# Patient Record
Sex: Female | Born: 1985
Health system: Southern US, Community
[De-identification: ages and names within clinical notes are randomized; demographics above are authoritative.]

## PROBLEM LIST (undated history)

## (undated) DIAGNOSIS — Z789 Other specified health status: Secondary | ICD-10-CM

## (undated) DIAGNOSIS — R519 Headache, unspecified: Secondary | ICD-10-CM

## (undated) HISTORY — PX: NO PAST SURGERIES: SHX2092

## (undated) HISTORY — DX: Headache, unspecified: R51.9

---

## 2012-09-23 ENCOUNTER — Encounter (HOSPITAL_COMMUNITY): Payer: Self-pay

## 2012-09-23 ENCOUNTER — Emergency Department (HOSPITAL_COMMUNITY)
Admission: EM | Admit: 2012-09-23 | Discharge: 2012-09-23 | Disposition: A | Discharge status: Home / Self Care | Payer: No Typology Code available for payment source | Source: Home / Self Care

## 2012-09-23 DIAGNOSIS — L605 Yellow nail syndrome: Secondary | ICD-10-CM

## 2012-09-23 DIAGNOSIS — B354 Tinea corporis: Secondary | ICD-10-CM

## 2012-09-23 DIAGNOSIS — J329 Chronic sinusitis, unspecified: Secondary | ICD-10-CM

## 2012-09-23 DIAGNOSIS — L608 Other nail disorders: Secondary | ICD-10-CM

## 2012-09-23 DIAGNOSIS — R51 Headache: Secondary | ICD-10-CM

## 2012-09-23 LAB — IRON AND TIBC: TIBC: 379 ug/dL (ref 250–470)

## 2012-09-23 LAB — CBC
HCT: 44.4 % (ref 36.0–46.0)
Hemoglobin: 14.8 g/dL (ref 12.0–15.0)
MCH: 31.8 pg (ref 26.0–34.0)
MCHC: 33.3 g/dL (ref 30.0–36.0)
MCV: 95.3 fL (ref 78.0–100.0)
Platelets: 324 10*3/uL (ref 150–400)
RBC: 4.66 MIL/uL (ref 3.87–5.11)
RDW: 11.8 % (ref 11.5–15.5)
WBC: 9 10*3/uL (ref 4.0–10.5)

## 2012-09-23 LAB — COMPREHENSIVE METABOLIC PANEL
ALT: 22 U/L (ref 0–35)
AST: 19 U/L (ref 0–37)
Albumin: 4.8 g/dL (ref 3.5–5.2)
Alkaline Phosphatase: 69 U/L (ref 39–117)
BUN: 9 mg/dL (ref 6–23)
CO2: 24 mEq/L (ref 19–32)
Calcium: 9.8 mg/dL (ref 8.4–10.5)
Chloride: 99 mEq/L (ref 96–112)
Creatinine, Ser: 0.5 mg/dL (ref 0.50–1.10)
GFR calc Af Amer: >90 mL/min (ref >90)
GFR calc non Af Amer: >90 mL/min (ref >90)
Glucose, Bld: 84 mg/dL (ref 70–99)
Potassium: 3.8 mEq/L (ref 3.5–5.1)
Sodium: 140 mEq/L (ref 135–145)
Total Bilirubin: 0.3 mg/dL (ref 0.3–1.2)
Total Protein: 8.5 g/dL — ABNORMAL HIGH (ref 6.0–8.3)

## 2012-09-23 LAB — FOLATE: Folate: >20 ng/mL

## 2012-09-23 LAB — POCT URINALYSIS DIP (DEVICE)
Bilirubin Urine: NEGATIVE
Nitrite: NEGATIVE
pH: 5 (ref 5.0–8.0)

## 2012-09-23 LAB — VITAMIN B12: Vitamin B-12: >2000 pg/mL — ABNORMAL HIGH (ref 211–911)

## 2012-09-23 MED ORDER — LORATADINE-PSEUDOEPHEDRINE ER 5-120 MG PO TB12: 1.0000 | ORAL_TABLET | Freq: Two times a day (BID) | ORAL | Status: DC

## 2012-09-23 MED ORDER — KETOCONAZOLE 2 % EX CREA: TOPICAL_CREAM | Freq: Every day | CUTANEOUS | Status: DC

## 2012-09-23 MED ORDER — AMOXICILLIN 875 MG PO TABS: 875.0000 mg | ORAL_TABLET | Freq: Two times a day (BID) | ORAL | Status: DC

## 2012-09-23 NOTE — ED Provider Notes (Signed)
History   CSN: [id number]  Arrival date & time 09/23/12  1030  Chief Complaint  Patient presents with  . Nail Problem   The history is provided by the patient. The history is limited by a language barrier. A language interpreter was used.  Pt reports that she is noticing that she is having yellow coloration of her fingernails and a rash on her legs. No recent changes in diet.  Urine has been bright yellow.  She has been noticing no changes to skin tone or to eyes.  No pruritus. Pt denies taking any medications except for some allergy medication (doesn't know name), pt denies any other problems or changes.  She is also having headaches.  She denies nausea and vomiting.  The patient reports no recent use of nail polish is or anything like that.  The patient reports that she's not having any abdominal pain nausea or vomiting.  History reviewed. No pertinent past medical history.  History reviewed. No pertinent past surgical history.  No family history on file.  History  Substance Use Topics  . Smoking status: Not on file  . Smokeless tobacco: Not on file  . Alcohol Use: Not on file    OB History    Grav Para Term Preterm Abortions TAB SAB Ect Mult Living                 Review of Systems  Constitutional: Negative.   HENT: Negative.   Eyes: Negative.   Respiratory: Negative.   Cardiovascular: Negative.   Gastrointestinal: Negative.   Genitourinary: Negative.   Musculoskeletal: Negative.   Skin:       Yellowing of nails   Neurological: Negative.   Hematological: Negative.   Psychiatric/Behavioral: Negative.     Allergies  Review of patient's allergies indicates no known allergies.  Home Medications  No current outpatient prescriptions on file.  BP 103/63  Pulse 60  Temp 98 F (36.7 C) (Oral)  Resp 19  SpO2 100%  Physical Exam  Nursing note and vitals reviewed. Constitutional: She is oriented to person, place, and time. She appears well-developed and  well-nourished. No distress.  HENT:  Head: Normocephalic and atraumatic.  Nose: Mucosal edema, rhinorrhea and sinus tenderness present. Right sinus exhibits frontal sinus tenderness. Left sinus exhibits frontal sinus tenderness.  Eyes: EOM are normal. Pupils are equal, round, and reactive to light.  Neck: Normal range of motion. Neck supple.  Cardiovascular: Normal rate, regular rhythm and normal heart sounds.   Pulmonary/Chest: Effort normal and breath sounds normal.  Abdominal: Soft. Bowel sounds are normal. She exhibits no distension and no mass. There is no tenderness. There is no rebound and no guarding.  Musculoskeletal: Normal range of motion. She exhibits no edema.       Hands: Neurological: She is alert and oriented to person, place, and time.  Skin: Skin is warm and dry. Rash noted.       Hypopigmented rash noted on left leg around calf area appears to be a yeast on the skin and signs of scratching noted  Psychiatric: She has a normal mood and affect. Her behavior is normal. Judgment and thought content normal.   ED Course  Procedures (including critical care time)  Labs Reviewed - No data to display No results found.   No diagnosis found.   MDM  IMPRESSION  Tinea corporis  Yellowing of nails   Headaches  sinusitis  RECOMMENDATIONS / PLAN Check CBC, CMP, urinalysis, liver enzymes, iron level, B12, folate, Vit  D Amoxicillin 875 bid  claritin D Ketoconazole 2% creme to rash on leg With the patient's yellow nails on going to try to rule out an underlying metabolic problem.  I'm going to check metabolic panel and CBC.  Check iron levels and vitamin D.  B12 levels.  Also order urinalysis because she reported that she's had bright yellow urine.  I'm looking for bilirubin.  FOLLOW UP 2 weeks   The patient was given clear instructions to go to ER or return to medical center if symptoms don't improve, worsen or new problems develop.  The patient verbalized  understanding.  The patient was told to call to get lab results if they haven't heard anything in the next week.            Cleora Fleet, MD 09/23/12 1442

## 2012-09-23 NOTE — ED Notes (Signed)
Complains of nail beds are yellow on her hands, stomach is hard and white spots on her left leg for a couple of weeks

## 2012-09-24 LAB — VITAMIN D 25 HYDROXY (VIT D DEFICIENCY, FRACTURES): Vit D, 25-Hydroxy: 34 ng/mL (ref 30–89)

## 2012-09-26 NOTE — Progress Notes (Signed)
Quick Note:  Please notify patient in Spanish that her Vitamin B12 level came back very high. It was over 2000. Please tell her that her other labs came back OK. Please tell patient that she needs to be referred to a hematologist to have further work up for the elevated B12 levels. Please tell patient to use NO NAIL POLISH on her yellow nails. I am not sure if the elevated B12 levels is causing her nails to be yellow. Please refer pt to a hematologist regarding high B12 levels and rule out polycythemia vera.   Rodney Langton, MD, CDE, FAAFP [facility] [place], [place]   ______

## 2012-09-27 ENCOUNTER — Telehealth (HOSPITAL_COMMUNITY): Payer: Self-pay

## 2012-09-27 NOTE — Telephone Encounter (Signed)
Message copied by Lestine Mount on Tue Sep 27, 2012  5:40 PM ------      Message from: Cleora Fleet      Created: Mon Sep 26, 2012  9:52 AM       Please notify patient in Spanish that her Vitamin B12 level came back very high.  It was over 2000.  Please tell her that her other labs came back OK.  Please tell patient that she needs to be referred to a hematologist to have further work up for the elevated B12 levels.  Please tell patient to use NO NAIL POLISH on her yellow nails.  I am not sure if the elevated B12 levels is causing her nails to be yellow.  Please refer pt to a hematologist regarding high B12 levels and rule out polycythemia vera.              Rodney Langton, MD, CDE, FAAFP      [facility]      [facility]      [place], [place]

## 2012-09-28 ENCOUNTER — Telehealth (HOSPITAL_COMMUNITY): Payer: Self-pay

## 2012-09-28 NOTE — Telephone Encounter (Signed)
Message copied by Lestine Mount on Wed Sep 28, 2012 10:16 AM ------      Message from: Cleora Fleet      Created: Mon Sep 26, 2012  9:52 AM       Please notify patient in Spanish that her Vitamin B12 level came back very high.  It was over 2000.  Please tell her that her other labs came back OK.  Please tell patient that she needs to be referred to a hematologist to have further work up for the elevated B12 levels.  Please tell patient to use NO NAIL POLISH on her yellow nails.  I am not sure if the elevated B12 levels is causing her nails to be yellow.  Please refer pt to a hematologist regarding high B12 levels and rule out polycythemia vera.              Rodney Langton, MD, CDE, FAAFP      [facility]      [facility]      [place], [place]

## 2012-09-28 NOTE — Telephone Encounter (Signed)
Message copied by Lestine Mount on Wed Sep 28, 2012 10:48 AM ------      Message from: Cleora Fleet      Created: Mon Sep 26, 2012  9:52 AM       Please notify patient in Spanish that her Vitamin B12 level came back very high.  It was over 2000.  Please tell her that her other labs came back OK.  Please tell patient that she needs to be referred to a hematologist to have further work up for the elevated B12 levels.  Please tell patient to use NO NAIL POLISH on her yellow nails.  I am not sure if the elevated B12 levels is causing her nails to be yellow.  Please refer pt to a hematologist regarding high B12 levels and rule out polycythemia vera.              Rodney Langton, MD, CDE, FAAFP      [facility]      [facility]      [place], [place]

## 2012-10-14 ENCOUNTER — Ambulatory Visit: Payer: No Typology Code available for payment source

## 2012-10-14 ENCOUNTER — Encounter: Payer: Self-pay | Admitting: Oncology

## 2012-10-14 ENCOUNTER — Ambulatory Visit (HOSPITAL_BASED_OUTPATIENT_CLINIC_OR_DEPARTMENT_OTHER): Payer: No Typology Code available for payment source | Admitting: Oncology

## 2012-10-14 ENCOUNTER — Other Ambulatory Visit (HOSPITAL_BASED_OUTPATIENT_CLINIC_OR_DEPARTMENT_OTHER): Payer: No Typology Code available for payment source | Admitting: Lab

## 2012-10-14 VITALS — BP 106/76 | HR 65 | Temp 97.3°F | Resp 18 | Ht 60.0 in | Wt 107.2 lb

## 2012-10-14 DIAGNOSIS — E538 Deficiency of other specified B group vitamins: Secondary | ICD-10-CM

## 2012-10-14 DIAGNOSIS — R21 Rash and other nonspecific skin eruption: Secondary | ICD-10-CM

## 2012-10-14 DIAGNOSIS — M2550 Pain in unspecified joint: Secondary | ICD-10-CM

## 2012-10-14 DIAGNOSIS — L608 Other nail disorders: Secondary | ICD-10-CM

## 2012-10-14 DIAGNOSIS — R7989 Other specified abnormal findings of blood chemistry: Secondary | ICD-10-CM

## 2012-10-14 LAB — CBC WITH DIFFERENTIAL/PLATELET
Basophils Absolute: 0 10*3/uL (ref 0.0–0.1)
Eosinophils Absolute: 0.1 10*3/uL (ref 0.0–0.5)
HGB: 13.3 g/dL (ref 11.6–15.9)
LYMPH%: 33.7 % (ref 14.0–49.7)
MCV: 94.6 fL (ref 79.5–101.0)
MONO%: 7.7 % (ref 0.0–14.0)
NEUT#: 4.7 10*3/uL (ref 1.5–6.5)
Platelets: 275 10*3/uL (ref 145–400)
RBC: 4.21 10*6/uL (ref 3.70–5.45)

## 2012-10-14 LAB — MORPHOLOGY

## 2012-10-14 NOTE — Progress Notes (Signed)
Pt speaks Spanish and little [place].  States no medications at this time and no known allergies.  Assessment deferred to Dr. Gaylyn Rong.

## 2012-10-14 NOTE — Progress Notes (Signed)
Checked in new patient. Spanish but no interprtor. She did well..just a little english. No insurance or medicaid. She does have orange card. I have her application for medicaid and for our assistance. She did say she is [place] citizen. 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 I told her to get our application back to me and gave her directions to DSS for medicaid.

## 2012-10-14 NOTE — Progress Notes (Signed)
[facility]  [phone] [phone]     INITIAL HEMATOLOGY CONSULTATION    Referral MD:  Dr. Cleora Fleet, M.D.   Reason for Referral: elevated Vit B12 level.     HPI: Nichole Hughes is a 27 year-old woman from [place].  He was in [employer].  She noticed diffuse bone pain in the shoulders and weakness. She thought that she had anemia, and started taking VitB12 on her own.  She visit her PCP and VitB12 level was checked which was elevated.  She was thus kindly referred to the [facility].   Nichole Hughes presented to the Cancer Center for the first time today with her sister.  She still has bilateral shoulder pain.  She has some weakness; however, she is working almost full time at [employer].  She has stopped taking Vit B12. She has slightly decrease appetite but no significant weight loss.  She denied visible source of bleeding.  She complained of skin rash on the face and bilateral feet.  They used to be rash rash and now turned to whitish flecks.  She denied fingers or toes swelling or pain. She complained of yellow nails lately.  She denied chest pain, SOB, pleurisy.   Patient denies fever, headache, visual changes, confusion, drenching night sweats, palpable lymph node swelling, mucositis, odynophagia, dysphagia, nausea vomiting, jaundice, gum bleeding, epistaxis, hematemesis, hemoptysis, abdominal pain, abdominal swelling, early satiety, melena, hematochezia, hematuria, skin rash, spontaneous bleeding, heat or cold intolerance, bowel bladder incontinence, back pain, focal motor weakness, paresthesia.    No past medical history on file.:  None.     No past surgical history on file.:  None.    CURRENT MEDS: No current outpatient prescriptions on file.      No Known Allergies:  No family history on file.:  History   Social History  . Marital Status: Single    Spouse Name: N/A    Number of Children: 0  . Years of  Education: N/A   Occupational History  .      [employer]   Social History Main Topics  . Smoking status: Not on file  . Smokeless tobacco: Not on file  . Alcohol Use: Not on file  . Drug Use: Not on file  . Sexually Active: Not on file   Other Topics Concern  . Not on file   Social History Narrative  . No narrative on file  :  REVIEW OF SYSTEM:  The rest of the 14-point review of sytem was negative.   Exam: ECOG 0-1  General:  Thin-appearing woman in no acute distress.  Eyes:  no scleral icterus.  ENT:  There were no oropharyngeal lesions.  Neck was without thyromegaly.  Lymphatics:  Negative cervical, supraclavicular or axillary adenopathy.  Respiratory: lungs were clear bilaterally without wheezing or crackles.  Cardiovascular:  Regular rate and rhythm, S1/S2, without murmur, rub or gallop.  There was no pedal edema.  GI:  abdomen was soft, flat, nontender, nondistended, without organomegaly.  Muscoloskeletal:  no spinal tenderness of palpation of vertebral spine.  She had yellow nails without cyanosis or clubbing.  Skin exam was without echymosis, petichae. I could not appreciate any rash on her face or feet.  Neuro exam was nonfocal.  Patient was able to get on and off exam table without assistance.  Gait was normal.  Patient was alerted and oriented.  Attention was good.   Language was appropriate.  Mood was normal without depression.  Speech was not pressured.  Thought content was not tangential.    LABS:  Lab Results  Component Value Date   WBC 8.3 10/14/2012   HGB 13.3 10/14/2012   HCT 39.8 10/14/2012   PLT 275 10/14/2012   GLUCOSE 84 09/23/2012   ALT 22 09/23/2012   AST 19 09/23/2012   NA 140 09/23/2012   K 3.8 09/23/2012   CL 99 09/23/2012   CREATININE 0.50 09/23/2012   BUN 9 09/23/2012   CO2 24 09/23/2012    Blood smear review:   I personally reviewed the patient's peripheral blood smear today.  There was isocytosis.  There was no peripheral blast.  There was no  schistocytosis, spherocytosis, target cell, rouleaux formation, tear drop cell.  There was no giant platelets or platelet clumps.      ASSESSMENT AND PLAN:   1.  Elevated Vit B12:  There is no evidence of bone marrow problem.  In elderly patients with Vit 12 elevation, MDS is a potential issue.  Nichole Hughes has normal CBC and morphology, and I have no clinical concern for bone marrow failure.  She has since stopped taking Vit B12.  There is no reason to check Vit B12 level again unless she develops anemia in the future.  No further work up is indicated at this time.   2.  Yellow nail:  Unclear etiology.  I referred her back to her PCP to see if further work up is indicated for any type of pulmonary issue that can cause yellow nail syndrome.  But from my history, there was no pulmonary issue to warrant obvious emergent work up.   3.  Skin rash and joint pain:  I referred her back to her PCP to see if autoimmune work up is appropriate.   4.  Dispo:  Discharged from the Cancer Center.  Follow up PRN.    Thank you for this referral.   The length of time of the face-to-face encounter was 30 minutes. More than 50% of time was spent counseling and coordination of care.     Thank you for this referral.

## 2012-11-15 ENCOUNTER — Encounter: Payer: Self-pay | Admitting: Oncology

## 2012-11-15 NOTE — Progress Notes (Signed)
Received the application for assistance back from the patient as well as the app for Medicaid. She was suppose to mail or take back to them. I tried to call to tell her but the line is busy at [phone].

## 2012-11-22 ENCOUNTER — Encounter: Payer: Self-pay | Admitting: Oncology

## 2012-11-22 NOTE — Progress Notes (Signed)
Never received current bank statement from the patient. She also bought back the medicaid application and it was not filled out. This EPP will expire 12/08/12.

## 2012-11-23 ENCOUNTER — Emergency Department (HOSPITAL_COMMUNITY)
Admission: EM | Admit: 2012-11-23 | Discharge: 2012-11-23 | Disposition: A | Discharge status: Home / Self Care | Payer: No Typology Code available for payment source | Source: Home / Self Care

## 2012-11-23 ENCOUNTER — Encounter (HOSPITAL_COMMUNITY): Payer: Self-pay

## 2012-11-23 DIAGNOSIS — K2990 Gastroduodenitis, unspecified, without bleeding: Secondary | ICD-10-CM

## 2012-11-23 DIAGNOSIS — K2970 Gastritis, unspecified, without bleeding: Secondary | ICD-10-CM

## 2012-11-23 MED ORDER — ONDANSETRON 4 MG PO TBDP: 4.0000 mg | ORAL_TABLET | Freq: Four times a day (QID) | ORAL | Status: DC | PRN

## 2012-11-23 NOTE — ED Notes (Signed)
Patient  States started vomiting today with fever

## 2012-11-23 NOTE — ED Provider Notes (Signed)
History     CSN: [id number]  Arrival date & time 11/23/12  1230   Chief Complaint  Patient presents with  . Emesis    (Consider location/radiation/quality/duration/timing/severity/associated sxs/prior treatment) HPI  Patient is a 27 year old female who presents with main concern of 2 day duration of nonbloody vomiting associated with a generalized abdominal discomfort. Patient denies similar episodes in the past, no specific sick contacts or exposures, no recent hospitalizations, no recent antibiotic use. Patient denies chest pain or shortness of breath. Patient describes abdominal pain as intermittent in nature, dull, crampy-like, 3/10 in severity when present, nonradiating, no specific alleviating or aggravating factors. She denies fevers and chills, no diarrhea, no other systemic concerns.  No past medical history  History reviewed. No pertinent past surgical history.  No known family medical history  History  Substance Use Topics  . Smoking status: Not on file  . Smokeless tobacco: Not on file  . Alcohol Use: Not on file    OB History   Grav Para Term Preterm Abortions TAB SAB Ect Mult Living                  Review of Systems Review of Systems  Constitutional: Negative for fever, chills, diaphoresis, activity change, appetite change and fatigue.  HENT: Negative for ear pain, nosebleeds, congestion, facial swelling, rhinorrhea, neck pain, neck stiffness and ear discharge.   Eyes: Negative for pain, discharge, redness, itching and visual disturbance.  Respiratory: Negative for cough, choking, chest tightness, shortness of breath, wheezing and stridor.   Cardiovascular: Negative for chest pain, palpitations and leg swelling.  Gastrointestinal: Negative for abdominal distention.  Genitourinary: Negative for dysuria, urgency, frequency, hematuria, flank pain, decreased urine volume, difficulty urinating and dyspareunia.  Musculoskeletal: Negative for back pain, joint  swelling, arthralgias and gait problem.  Neurological: Negative for dizziness, tremors, seizures, syncope, facial asymmetry, speech difficulty, weakness, light-headedness, numbness and headaches.  Hematological: Negative for adenopathy. Does not bruise/bleed easily.  Psychiatric/Behavioral: Negative for hallucinations, behavioral problems, confusion, dysphoric mood, decreased concentration and agitation.    Allergies  Review of patient's allergies indicates no known allergies.  Home Medications  No current outpatient prescriptions on file.  Temp(Src) 98 F (36.7 C) (Oral)  Physical Exam Physical Exam  Constitutional: Appears well-developed and well-nourished. No distress.  HENT: Normocephalic. External right and left ear normal. Oropharynx is clear and moist.  Eyes: Conjunctivae and EOM are normal. PERRLA, no scleral icterus.  Neck: Normal ROM. Neck supple. No JVD. No tracheal deviation. No thyromegaly.  CVS: RRR, S1/S2 +, no murmurs, no gallops, no carotid bruit.  Pulmonary: Effort and breath sounds normal, no stridor, rhonchi, wheezes, rales.  Abdominal: Soft. BS +,  no distension, tenderness, rebound or guarding.  Musculoskeletal: Normal range of motion. No edema and no tenderness.  Lymphadenopathy: No lymphadenopathy noted, cervical, inguinal. Neuro: Alert. Normal reflexes, muscle tone coordination. No cranial nerve deficit. Skin: Skin is warm and dry. No rash noted. Not diaphoretic. No erythema. No pallor.  Psychiatric: Normal mood and affect. Behavior, judgment, thought content normal.    ED Course  Procedures (including critical care time)  Labs Reviewed - No data to display No results found.  Viral gastroenteritis  - Clinical symptoms consistent with viral gastroenteritis -  Will provide Zofran for symptomatic relief of nausea vomiting - Patient advised to drink fluids hourly - I have also discussed cutting on oral intake down to clear liquids and slowly advancing to  regular diet as patient able to tolerate - Patient and  advised to come back and see [place] if her symptoms do not improve or get worse   MDM  Viral gastroenteritis         Alison Murray, MD 11/23/12 1321

## 2012-11-28 ENCOUNTER — Encounter: Payer: Self-pay | Admitting: Oncology

## 2012-11-28 NOTE — Progress Notes (Signed)
100% ind 11/28/12-05/31/13  I will send the patient letter and card and all documents have been scanned.

## 2013-01-10 NOTE — ED Notes (Signed)
Referral faxed to [facility] 

## 2013-05-02 ENCOUNTER — Ambulatory Visit: Payer: Self-pay

## 2013-06-26 ENCOUNTER — Ambulatory Visit (HOSPITAL_COMMUNITY)
Admission: RE | Admit: 2013-06-26 | Discharge: 2013-06-26 | Disposition: A | Discharge status: Home / Self Care | Payer: No Typology Code available for payment source | Source: Ambulatory Visit | Attending: Internal Medicine | Admitting: Internal Medicine

## 2013-06-26 ENCOUNTER — Encounter: Payer: Self-pay | Admitting: Internal Medicine

## 2013-06-26 ENCOUNTER — Ambulatory Visit: Payer: No Typology Code available for payment source | Attending: Internal Medicine | Admitting: Internal Medicine

## 2013-06-26 VITALS — BP 114/74 | HR 59 | Temp 98.4°F | Resp 16 | Ht 62.0 in | Wt 110.0 lb

## 2013-06-26 DIAGNOSIS — L819 Disorder of pigmentation, unspecified: Secondary | ICD-10-CM

## 2013-06-26 DIAGNOSIS — M79609 Pain in unspecified limb: Secondary | ICD-10-CM

## 2013-06-26 DIAGNOSIS — M79604 Pain in right leg: Secondary | ICD-10-CM

## 2013-06-26 DIAGNOSIS — Z0000 Encounter for general adult medical examination without abnormal findings: Secondary | ICD-10-CM

## 2013-06-26 DIAGNOSIS — Z23 Encounter for immunization: Secondary | ICD-10-CM

## 2013-06-26 DIAGNOSIS — J029 Acute pharyngitis, unspecified: Secondary | ICD-10-CM

## 2013-06-26 DIAGNOSIS — Z130 Encounter for screening for diseases of the blood and blood-forming organs and certain disorders involving the immune mechanism: Secondary | ICD-10-CM | POA: Insufficient documentation

## 2013-06-26 MED ORDER — TRAMADOL HCL 50 MG PO TABS: 50.0000 mg | ORAL_TABLET | Freq: Three times a day (TID) | ORAL | Status: DC | PRN

## 2013-06-26 MED ORDER — GUAIFENESIN ER 600 MG PO TB12: 1200.0000 mg | ORAL_TABLET | Freq: Two times a day (BID) | ORAL | Status: DC | PRN

## 2013-06-26 NOTE — Addendum Note (Signed)
Addended by: Allayne Stack R on: 06/26/2013 11:03 AM   Modules accepted: Orders

## 2013-06-26 NOTE — Progress Notes (Signed)
CC: Leg pain, congestion  HPI: 27 year old female with no significant past medical history who presented to clinic for evaluation of right lower extremity pain for past couple of weeks. Patient reports pain in the calf muscle but is able to ambulate. Patient also reports feeling sore throat and lot of congestion in the upper respiratory airways. She does report cough but nothing comes out. No fevers or chills. No chest pain. No wheezing or stridor. Patient also reports occasional headaches but no blurry vision.  No Known Allergies History reviewed. No pertinent past medical history. Current Outpatient Prescriptions on File Prior to Visit  Medication Sig Dispense Refill  . ondansetron (ZOFRAN ODT) 4 MG disintegrating tablet Take 1 tablet (4 mg total) by mouth every 6 (six) hours as needed for nausea.  45 tablet  1   No current facility-administered medications on file prior to visit.   Heart disease in parents.  History   Social History  . Marital Status: Single    Spouse Name: N/A    Number of Children: 0  . Years of Education: N/A   Occupational History  .      [employer]   Social History Main Topics  . Smoking status: Never Smoker   . Smokeless tobacco: Not on file  . Alcohol Use: No  . Drug Use: No  . Sexual Activity: Not on file   Other Topics Concern  . Not on file   Social History Narrative  . No narrative on file    Review of Systems  Constitutional: Negative for fever, chills, diaphoresis, activity change, appetite change and fatigue.  HENT: Negative for ear pain, nosebleeds, congestion, facial swelling, rhinorrhea, neck pain, neck stiffness and ear discharge.   Eyes: Negative for pain, discharge, redness, itching and visual disturbance.  Respiratory: Negative for cough, choking, chest tightness, shortness of breath, wheezing and stridor.   Cardiovascular: Negative for chest pain, palpitations and leg swelling.  Gastrointestinal: Negative for abdominal  distention.  Genitourinary: Negative for dysuria, urgency, frequency, hematuria, flank pain, decreased urine volume, difficulty urinating and dyspareunia.  Musculoskeletal: Negative for back pain, joint swelling, arthralgias and gait problem.  pain in the right calf and lower extremity Neurological: Negative for dizziness, tremors, seizures, syncope, facial asymmetry, speech difficulty, weakness, light-headedness, numbness and positive for headaches.  Hematological: Negative for adenopathy. Does not bruise/bleed easily.  Psychiatric/Behavioral: Negative for hallucinations, behavioral problems, confusion, dysphoric mood, decreased concentration and agitation.    Objective:   Filed Vitals:   06/26/13 0954  BP: 114/74  Pulse: 59  Temp: 98.4 F (36.9 C)  Resp: 16    Physical Exam  Constitutional: Appears well-developed and well-nourished. No distress.  HENT: Normocephalic. External right and left ear normal. Oropharynx is clear and moist.  Eyes: Conjunctivae and EOM are normal. PERRLA, no scleral icterus.  Neck: Normal ROM. Neck supple. No JVD. No tracheal deviation. No thyromegaly.  CVS: RRR, S1/S2 +, no murmurs, no gallops, no carotid bruit.  Pulmonary: Effort and breath sounds normal, no stridor, rhonchi, wheezes, rales.  Abdominal: Soft. BS +,  no distension, tenderness, rebound or guarding.  Musculoskeletal: Normal range of motion. No edema and no tenderness.  Lymphadenopathy: No lymphadenopathy noted, cervical, inguinal. Neuro: Alert. Normal reflexes, muscle tone coordination. No cranial nerve deficit. Skin: Whitish discoloration of her left lower extremity, random patches of whitish skin.  Psychiatric: Normal mood and affect. Behavior, judgment, thought content normal.   Lab Results  Component Value Date   WBC 8.3 10/14/2012   HGB 13.3  10/14/2012   HCT 39.8 10/14/2012   MCV 94.6 10/14/2012   PLT 275 10/14/2012   Lab Results  Component Value Date   CREATININE 0.50 09/23/2012    BUN 9 09/23/2012   NA 140 09/23/2012   K 3.8 09/23/2012   CL 99 09/23/2012   CO2 24 09/23/2012    No results found for this basename: HGBA1C   Lipid Panel  No results found for this basename: chol, trig, hdl, cholhdl, vldl, ldlcalc       Assessment and plan:   Patient Active Problem List   Diagnosis Date Noted  .  Whitish skin discoloration  - Dermatology referral provided and patient advised not to use cortisone-containing creams - Check TSH and ANA  09/26/2012     Sore throat, congestion  - Start Mucinex twice daily as needed and patient instructed to see [place] in 2 weeks to see if symptoms are improving      Health maintenance - referral to GYN provided for screening PAP - last PAP smear done in 2005, negative - flu shot today     Right lower extremity pain  - Order lower extremity Doppler to rule out DVT and to analyze for possible Baker's cyst

## 2013-06-26 NOTE — Progress Notes (Signed)
Vascular lab called with results Doppler was neg DVT

## 2013-06-26 NOTE — Progress Notes (Signed)
Pt is here today for a F/U visit. For 2 months she has had chest congestion. Coughing up phlegm and runny nose. Pt reports that she is having hair loss for 3 months Pt also has discoloration on her legs. She received a topical cream that didn't help.

## 2013-06-26 NOTE — Progress Notes (Signed)
VASCULAR LAB PRELIMINARY  PRELIMINARY  PRELIMINARY  PRELIMINARY  Right lower extremity venous duplex completed.    Preliminary report:  Right:  No evidence of DVT, superficial thrombosis, or Baker's cyst.  Knowledge Escandon, RVS 06/26/2013, 12:52 PM

## 2013-06-26 NOTE — Patient Instructions (Addendum)
Dolor de garganta   (Sore Throat)   El dolor de garganta es el dolor, ardor, irritación o sensación de picazón en la garganta. Generalmente hay dolor o molestias al tragar o hablar. Un dolor de garganta puede estar acompañado de otros síntomas, como tos, estornudos, fiebre y ganglios hinchados en el cuello. Generalmente es el primer signo de otra enfermedad, como un resfrio, gripe, anginas o mononucleosis (conocida como mono). La mayor parte de los dolores de garganta desaparecen sin tratamiento médico.  CAUSAS   Las causas más comunes de dolor de garganta son:   · Infecciones virales, como un resfrio, gripe o mononucleosis.  · Infección bacteriana, como faringitis estreptocócica, amigdalitis, o tos ferina.  · Alergias estacionales.  · La sequedad en el aire.  · Algunos irritantes, como el humo o la polución.  · Reflujo gastroesofágico.  INSTRUCCIONES PARA EL CUIDADO EN EL HOGAR   · Tome sólo la medicación que le indicó el médico.  · Debe ingerir gran cantidad de líquido para mantener la orina de tono claro o color amarillo pálido.  · Descanse todo lo que sea necesario.  · Trate de usar aerosoles para la garganta, pastillas o chupe caramelos duros para aliviar el dolor (si es mayor de 4 años o según lo que le indiquen).  · Beba líquidos calientes, como caldos, infusiones de hierbas o agua caliente con miel para calmar el dolor momentáneamente. También puede comer o beber líquidos fríos o congelados tales como paletas de hielo congelado.  · Haga gárgaras con agua con sal (mezclar 1 cucharadita de sal en 8 onzas [250 cm3] de agua).  · No fume, y evite el humo de otros fumadores.  · Ponga un humidificador de vapor frío en la habitación por la noche para humedecer el aire. También se puede activar en una ducha de agua caliente y sentarse en el baño con la puerta cerrada durante 5-10 minutos.  SOLICITE ATENCIÓN MÉDICA DE INMEDIATO SI:   · Tiene dificultad para respirar.  · No puede tragar líquidos, alimentos blandos, o  su saliva.  · Usted tiene más inflamación en la garganta.  · El dolor de garganta no mejora en 7 días.  · Tiene náuseas o vómitos.  · Tiene fiebre o síntomas que persisten durante más de 2 o 3 días.  · Tiene fiebre y los síntomas empeoran de manera súbita.  ASEGÚRESE DE QUE:   · Comprende estas instrucciones.  · Controlará su enfermedad.  · Solicitará ayuda de inmediato si no mejora o si empeora.  Document Released: 08/31/2005 Document Revised: 08/17/2012  ExitCare® Patient Information ©2014 ExitCare, LLC.

## 2013-08-07 ENCOUNTER — Encounter: Payer: No Typology Code available for payment source | Admitting: Advanced Practice Midwife

## 2014-04-20 ENCOUNTER — Emergency Department (INDEPENDENT_AMBULATORY_CARE_PROVIDER_SITE_OTHER)
Admission: EM | Admit: 2014-04-20 | Discharge: 2014-04-20 | Disposition: A | Discharge status: Home / Self Care | Payer: Self-pay | Source: Home / Self Care | Attending: Emergency Medicine | Admitting: Emergency Medicine

## 2014-04-20 ENCOUNTER — Emergency Department (INDEPENDENT_AMBULATORY_CARE_PROVIDER_SITE_OTHER): Payer: Self-pay

## 2014-04-20 ENCOUNTER — Encounter (HOSPITAL_COMMUNITY): Payer: Self-pay | Admitting: Emergency Medicine

## 2014-04-20 DIAGNOSIS — M779 Enthesopathy, unspecified: Secondary | ICD-10-CM

## 2014-04-20 MED ORDER — TRAMADOL HCL 50 MG PO TABS: 100.0000 mg | ORAL_TABLET | Freq: Three times a day (TID) | ORAL | Status: DC | PRN

## 2014-04-20 MED ORDER — MELOXICAM 15 MG PO TABS: 15.0000 mg | ORAL_TABLET | Freq: Every day | ORAL | Status: DC

## 2014-04-20 NOTE — ED Notes (Signed)
Pt reports pain to left thumb onset yest Not sure if she inj thumb at work Unable to make fist due to pain; pain is 10/10 Sx include swelling Alert w/no signs of acute distress.

## 2014-04-20 NOTE — Discharge Instructions (Signed)
Tendinitis (Tendinitis) La tendinitis es la hinchazn e inflamacin de los tendones. Los tendones son tejidos similares a una banda que conectan el msculo al [place]. La tendinitis suele producirse en:   Los hombros (manguito rotador).  Los talones (tendn de [place]).  Los codos (tendn del trceps). CAUSAS La tendinitis suele originarse en el uso excesivo del tendn, los msculos y la articulacin involucrados. Cuando el tejido que rodea al tendn (la sinovia) se inflama, se denomina tenosinovitis. En general, la tendinitis se presenta en las personas que hacen trabajos que requieren movimientos repetitivos. SNTOMAS  Dolor.  Sensibilidad.  Inflamacin leve. DIAGNSTICO La tendinitis normalmente se diagnostica con un examen fsico. El mdico tambin podr solicitar radiografas u otros estudios de diagnstico por imgenes. TRATAMIENTO El mdico podr recomendar determinados medicamentos o ejercicios para Scientist, research (medical)el tratamiento. INSTRUCCIONES PARA EL CUIDADO EN EL HOGAR   Use un cabestrillo o una frula durante todo el tiempo indicado por el mdico hasta que Secretary/administratordisminuya el dolor.  Aplique hielo sobre la zona lesionada.  Ponga el hielo en una bolsa plstica.  Colquese una toalla entre la piel y la bolsa de hielo.  Aplique el hielo durante 15 a 20minutos, 3 a 4veces por da, o como se lo haya indicado el mdico.  Evite utilizar la extremidad mientras sienta dolor en el tendn. Haga ejercicios suaves con amplitud de movimientos solamente como se los haya indicado el mdico. Suspenda los ejercicios si el dolor o las molestias [place]aumentan, a menos que el mdico le indique otra cosa.  Utilice los medicamentos de venta libre o recetados para Primary school teachercalmar el dolor, Environmental health practitionerel malestar o la fiebre, segn se lo indique el mdico. SOLICITE ATENCIN MDICA SI:   El dolor y la hinchazn aumentan.  Presenta sntomas nuevos o desconocidos, especialmente mayor adormecimiento en las manos. ASEGRESE DE QUE:    Comprende estas instrucciones.  Controlar su afeccin.  Recibir ayuda de inmediato si no mejora o si empeora. Document Released: 06/10/2005 Document Revised: 01/15/2014 [facility] Patient Information 2015 [place], [place]. This information is not intended to replace advice given to you by your health care provider. Make sure you discuss any questions you have with your health care provider.

## 2014-04-20 NOTE — ED Provider Notes (Signed)
  Chief Complaint   Chief Complaint  Patient presents with  . Hand Pain    History of Present Illness   Nichole Hughes is a 28 year old female who has had a two-day history of pain in her right thumb localized to the MCP joint. She denies any definite trauma, although does recall an striking her hand at work. This is not a workers comp case. She uses her hands quite a bit and her [place] [employer]. It hurts to flex and extend the thumb. She has a fairly good range of motion.  Review of Systems   Other than as noted above, the patient denies any of the following symptoms: Systemic:  No fevers or chills. Musculoskeletal:  No joint pain or arthritis.  Neurological:  No muscular weakness or paresthesias.  PMFSH   Past medical history, family history, social history, meds, and allergies were reviewed.     Physical Examination   Vital signs:  BP 113/77  Pulse 66  Temp(Src) 98 F (36.7 C) (Oral)  Resp 16  SpO2 100%  LMP 04/17/2014 Gen:  Alert and oriented times 3.  In no distress. Musculoskeletal:  Exam of the hand reveals no swelling or deformity. There is pain to palpation over the MCP joint of the thumb. All joints have full range of motion.  Otherwise, all joints had a full a ROM with no swelling, bruising or deformity.  No edema, pulses full. Extremities were warm and pink.  Capillary refill was brisk.  Skin:  Clear, warm and dry.  No rash. Neuro:  Alert and oriented times 3.  Muscle strength was normal.  Sensation was intact to light touch.   Radiology   Dg Finger Thumb Left  04/20/2014   CLINICAL DATA:  Injury.  Pain.  EXAM: LEFT THUMB 2+V  COMPARISON:  None.  FINDINGS: There is no evidence of fracture or dislocation. There is no evidence of arthropathy or other focal bone abnormality. Soft tissues are unremarkable  IMPRESSION: Negative.   Electronically Signed   By: Davonna BellingJohn  Curnes M.D.   On: 04/20/2014 15:04   I reviewed the images independently  and personally and concur with the radiologist's findings.  [facility]   Placed in a thumb spica.  Assessment   The encounter diagnosis was Tendonitis.  Plan  1.  Meds:  The following meds were prescribed:   Discharge Medication List as of 04/20/2014  3:33 PM    START taking these medications   Details  meloxicam (MOBIC) 15 MG tablet Take 1 tablet (15 mg total) by mouth daily., Starting 04/20/2014, Until Discontinued, Normal    !! traMADol (ULTRAM) 50 MG tablet Take 2 tablets (100 mg total) by mouth every 8 (eight) hours as needed., Starting 04/20/2014, Until Discontinued, Print     !! - Potential duplicate medications found. Please discuss with provider.      2.  Patient Education/Counseling:  The patient was given appropriate handouts, self care instructions, and instructed in symptomatic relief, including rest and activity, and elevation.   3.  Follow up:  The patient was told to follow up here if no better in 3 to 4 days, or sooner if becoming worse in any way, and given some red flag symptoms such as worsening pain, fever, swelling, or neurological symptoms which would prompt immediate return.        Reuben Likesavid C Tyniesha Howald, MD 04/20/14 2218

## 2014-05-17 ENCOUNTER — Ambulatory Visit: Payer: Self-pay

## 2014-05-17 VITALS — BP 108/72 | HR 65 | Temp 98.8°F | Resp 16

## 2014-05-17 NOTE — Progress Notes (Unsigned)
Patient presents to walk-in clinic with c/o vaginal itching for three days. Patient denies any odor,abnormal color to discharge, or abdominal pain. Patient states she has a small amount of white vaginal discharge. Patient has had intercourse on Saturday with protection. Patient declined to test for STD's. Vaginal swab obtained by patient for testing for yeast, trich, and bacteria vaginosis. Annamaria Helling, RN

## 2014-09-17 ENCOUNTER — Ambulatory Visit: Payer: Self-pay | Attending: Internal Medicine | Admitting: Internal Medicine

## 2014-09-17 ENCOUNTER — Encounter: Payer: Self-pay | Admitting: Emergency Medicine

## 2014-09-17 VITALS — BP 132/82 | HR 68 | Temp 98.1°F | Resp 16 | Ht 60.0 in | Wt 108.0 lb

## 2014-09-17 DIAGNOSIS — N898 Other specified noninflammatory disorders of vagina: Secondary | ICD-10-CM | POA: Insufficient documentation

## 2014-09-17 DIAGNOSIS — Z124 Encounter for screening for malignant neoplasm of cervix: Secondary | ICD-10-CM

## 2014-09-17 DIAGNOSIS — Z79899 Other long term (current) drug therapy: Secondary | ICD-10-CM | POA: Insufficient documentation

## 2014-09-17 LAB — POCT URINALYSIS DIPSTICK
Bilirubin, UA: NEGATIVE
GLUCOSE UA: NEGATIVE
KETONES UA: NEGATIVE
LEUKOCYTES UA: NEGATIVE
NITRITE UA: NEGATIVE
PROTEIN UA: NEGATIVE
SPEC GRAV UA: 1.02
UROBILINOGEN UA: 0.2
pH, UA: 7.5

## 2014-09-17 LAB — HIV ANTIBODY (ROUTINE TESTING W REFLEX): HIV 1&2 Ab, 4th Generation: NONREACTIVE

## 2014-09-17 LAB — POCT URINE PREGNANCY: PREG TEST UR: NEGATIVE

## 2014-09-17 LAB — RPR

## 2014-09-17 NOTE — Progress Notes (Signed)
Patient LMP finished 12/23 Had unprotected sex shortly thereafter Patient reports foul vaginal discharge and the feeling that she has to urinate after she has already urinated She is in no medications and does not smoke. ANNA SMYTHE, rn

## 2014-09-17 NOTE — Progress Notes (Signed)
Patient ID: Nichole Hughes, female   DOB: [birth year], 29 y.o.   MRN: [id number]  CC: vaginal discharge  HPI: Nichole Hughes is a 29 y.o. female here today for a follow up visit.  Patient no past medical history.  Patient reports that for the past three weeks she has noticed a yellow thick vaginal discharge.  She has not noticed vaginal irritation. She reports that the discharge has a fishy odor.  LMP was 12/23 and she reports that she had unprotected sex around that time.  She is worried that she may become pregnant and would like advise on determining her status.   Patient has No headache, No chest pain, No abdominal pain - No Nausea, No new weakness tingling or numbness, No Cough - SOB.  No Known Allergies No past medical history on file. Current Outpatient Prescriptions on File Prior to Visit  Medication Sig Dispense Refill  . guaiFENesin (MUCINEX) 600 MG 12 hr tablet Take 2 tablets (1,200 mg total) by mouth 2 (two) times daily as needed for congestion. (Patient not taking: Reported on 09/17/2014) 60 tablet 0  . meloxicam (MOBIC) 15 MG tablet Take 1 tablet (15 mg total) by mouth daily. (Patient not taking: Reported on 09/17/2014) 15 tablet 0  . ondansetron (ZOFRAN ODT) 4 MG disintegrating tablet Take 1 tablet (4 mg total) by mouth every 6 (six) hours as needed for nausea. (Patient not taking: Reported on 09/17/2014) 45 tablet 1  . traMADol (ULTRAM) 50 MG tablet Take 1 tablet (50 mg total) by mouth every 8 (eight) hours as needed for pain. (Patient not taking: Reported on 09/17/2014) 45 tablet 0  . traMADol (ULTRAM) 50 MG tablet Take 2 tablets (100 mg total) by mouth every 8 (eight) hours as needed. (Patient not taking: Reported on 09/17/2014) 30 tablet 0   No current facility-administered medications on file prior to visit.   No family history on file. History   Social History  . Marital Status: Single    Spouse Name: N/A    Number of Children: 0  . Years of Education: N/A    Occupational History  .      [place]   Social History Main Topics  . Smoking status: Never Smoker   . Smokeless tobacco: Not on file  . Alcohol Use: No  . Drug Use: No  . Sexual Activity: Not on file   Other Topics Concern  . Not on file   Social History Narrative    Review of Systems: See HPI   Objective:   Filed Vitals:   09/17/14 0916  BP: 132/82  Pulse: 68  Temp: 98.1 F (36.7 C)  Resp: 16    Physical Exam  Cardiovascular: Normal rate, regular rhythm and normal heart sounds.   Pulmonary/Chest: Effort normal and breath sounds normal.  Abdominal: Soft. Bowel sounds are normal. She exhibits no distension. There is no tenderness.  Genitourinary: Rectum normal and uterus normal. There is no lesion on the right labia. There is no lesion on the left labia. Cervix exhibits friability. Cervix exhibits no motion tenderness and no discharge. Right adnexum displays no tenderness. Left adnexum displays no tenderness. Vaginal discharge found.  Lymphadenopathy:       Right: No inguinal adenopathy present.       Left: No inguinal adenopathy present.     Lab Results  Component Value Date   WBC 8.3 10/14/2012   HGB 13.3 10/14/2012   HCT 39.8 10/14/2012   MCV 94.6 10/14/2012   PLT 275  10/14/2012   Lab Results  Component Value Date   CREATININE 0.50 09/23/2012   BUN 9 09/23/2012   NA 140 09/23/2012   K 3.8 09/23/2012   CL 99 09/23/2012   CO2 24 09/23/2012    No results found for: HGBA1C Lipid Panel  No results found for: CHOL, TRIG, HDL, CHOLHDL, VLDL, LDLCALC     Assessment and plan:   Jaidah was seen today for follow-up.  Diagnoses and associated orders for this visit:  Vaginal discharge - Urinalysis Dipstick - POCT urine pregnancy - HIV antibody (with reflex) - RPR  Papanicolaou smear - Cytology - PAP [place] - Cervicovaginal ancillary only   Due to language barrier, an interpreter was present during the history-taking and subsequent  discussion (and for part of the physical exam) with this patient.  Return if symptoms worsen or fail to improve.        Holland Commons, NP-C [facility] [phone] 09/17/2014, 9:42 AM

## 2014-09-17 NOTE — Patient Instructions (Signed)
Sexo seguro (Safe Sex) El sexo seguro implica reducir el riesgo de transmitir o contagiarse enfermedades de transmisin sexual (ETS). Las ETS se transmiten por contacto sexual con los genitales, la boca o el recto. Algunas ETS pueden curarse, otras no. El sexo seguro tambin puede prevenir los embarazos no deseados.  CULES SON ALGUNAS DE LAS PRCTICAS DE SEXO SEGURO?  Limite las [address] sexuales a una sola pareja, que tenga relaciones sexuales solamente con usted.  Hable con su pareja sobre sus parejas pasadas, antecedentes de ETS y el uso de drogas.  Use un condn cada vez que tenga [employer]. Esto abarca la actividad sexual vaginal, oral y anal. Tanto los hombres como las mujeres deben usar condn durante el sexo oral. Use solamente condones de ltex o poliuretano y lubricantes a base de agua. La utilizacin de aceites o lubricantes a base de vaselina para lubricar el condn podran debilitarlo y [address] las posibilidades de que se rompa. El condn debe estar colocado desde el principio hasta el final de la actividad sexual. Usar un condn reduce el riesgo de transmitir o contagiarse una ETS, pero no lo elimina por completo. Las ETS pueden trasmitirse por contacto con los fluidos corporales y la piel infectados.  Aplquese las vacunas para la hepatitisB y [place] [facility].  Evite el uso de alcohol y drogas porque pueden afectar su criterio. Tal vez se olvide de usar un condn o participe en actividades sexuales de alto riesgo.  En el caso de las mujeres, deben evitar las duchas vaginales despus de Warehouse manager relaciones sexuales. Las duchas vaginales pueden propagar una infeccin a la profundidad del tracto reproductivo.  Revsese el cuerpo para ver si tiene signos de llagas, ampollas, erupciones o secreciones inusuales. Consulte a su mdico si observa alguno de estos signos.  Evite el contacto sexual si tiene sntomas de infeccin o est recibiendo tratamiento para una ETS. Si usted o su  pareja tiene herpes, evite el contacto sexual mientras tenga ampollas. Use condones en cualquier otro momento.  Si tiene riesgo de infectarse por el VIH, se recomienda tomar diariamente un medicamento recetado para evitar la infeccin. Esto se conoce como profilaxis previa a la exposicin. Se considera que est en riesgo si:  Es un hombre que tiene sexo con otros hombres.  Es heterosexual y es activo sexualmente con ms de una pareja.  Se inyecta drogas.  Es [place] sexualmente con Neomia Dear pareja que tiene VIH.  Consulte a su mdico para saber si tiene un alto riesgo de infectarse por el VIH. Si opta por comenzar la profilaxis previa a la exposicin, primero debe realizarse anlisis de deteccin del VIH. Luego, le harn anlisis cada mientras est tomando los medicamentos para la profilaxis previa a la exposicin.  Visite al mdico en forma regular para hacerse anlisis de deteccin, exmenes y pruebas para otras ETS. Antes de [employer] sexuales con una nueva pareja, ambos deben hacerse un anlisis de deteccin de ETS y deben [facility]. CULES SON LOS BENEFICIOS DEL SEXO SEGURO?   Existe una menor probabilidad de contagiarse o transmitir una ETS.  Puede prevenir los embarazos no deseados.  Al hablar [employer] seguro con su pareja, puede incrementar la sensacin de intimidad, comodidad, confianza y [employer]. Document Released: 08/31/2005 Document Revised: 09/05/2013 [facility] Patient Information 2015 [place], [place]. This information is not intended to replace advice given to you by your health care provider. Make sure you discuss any questions you have with your health care provider.

## 2014-09-18 LAB — CERVICOVAGINAL ANCILLARY ONLY
Chlamydia: NEGATIVE
NEISSERIA GONORRHEA: NEGATIVE
Wet Prep (BD Affirm): NEGATIVE
Wet Prep (BD Affirm): NEGATIVE
Wet Prep (BD Affirm): POSITIVE — AB

## 2014-09-18 LAB — CYTOLOGY - PAP

## 2014-09-19 ENCOUNTER — Telehealth: Payer: Self-pay | Admitting: Emergency Medicine

## 2014-09-19 ENCOUNTER — Other Ambulatory Visit: Payer: Self-pay | Admitting: Emergency Medicine

## 2014-09-19 DIAGNOSIS — R87619 Unspecified abnormal cytological findings in specimens from cervix uteri: Secondary | ICD-10-CM

## 2014-09-19 MED ORDER — METRONIDAZOLE 500 MG PO TABS: 500.0000 mg | ORAL_TABLET | Freq: Two times a day (BID) | ORAL | Status: DC

## 2014-09-19 NOTE — Telephone Encounter (Signed)
Pt given pap smear results with BV infection Instructed to take Flagyl 500 mg tab BID x 7 dys/refrain from alcohol intake while taking medicine  Pt also given Colpo appointment 10/11/2014 @ [facility] 930am Pt given direction and contact number [place] interpretor used for translation [facility]

## 2014-10-11 ENCOUNTER — Other Ambulatory Visit (HOSPITAL_COMMUNITY)
Admission: RE | Admit: 2014-10-11 | Discharge: 2014-10-11 | Disposition: A | Discharge status: Home / Self Care | Payer: Self-pay | Source: Ambulatory Visit | Attending: Family Medicine | Admitting: Family Medicine

## 2014-10-11 ENCOUNTER — Ambulatory Visit (INDEPENDENT_AMBULATORY_CARE_PROVIDER_SITE_OTHER): Payer: Self-pay | Admitting: Family Medicine

## 2014-10-11 VITALS — BP 110/74 | HR 76 | Temp 98.0°F | Ht 60.0 in | Wt 104.0 lb

## 2014-10-11 DIAGNOSIS — R87810 Cervical high risk human papillomavirus (HPV) DNA test positive: Secondary | ICD-10-CM | POA: Insufficient documentation

## 2014-10-11 DIAGNOSIS — Z01411 Encounter for gynecological examination (general) (routine) with abnormal findings: Secondary | ICD-10-CM | POA: Insufficient documentation

## 2014-10-11 DIAGNOSIS — Z1151 Encounter for screening for human papillomavirus (HPV): Secondary | ICD-10-CM | POA: Insufficient documentation

## 2014-10-11 DIAGNOSIS — R896 Abnormal cytological findings in specimens from other organs, systems and tissues: Secondary | ICD-10-CM

## 2014-10-11 DIAGNOSIS — IMO0002 Reserved for concepts with insufficient information to code with codable children: Secondary | ICD-10-CM

## 2014-10-11 NOTE — Progress Notes (Signed)
Patient ID: Nichole Hughes, female   DOB: 01/24/1986, 29 y.o.   MRN: [id number]   1st pap ever was LGSIL Interpretor: Nichole Hughes Patient given informed consent, signed copy in the chart.  Placed in lithotomy position. Cervix viewed with speculum and colposcope after application of acetic acid.   Colposcopy adequate (entire squamocolumnar junctions seen  in entirety) ?  Yes--normal glandular tissue/ectropion Acetowhite lesions?no Punctation?no Mosaicism?  no Abnormal vasculature?  no Biopsies?yes--endocervical per ECC?with cytobrush Complications? none  COMMENTS: Patient was given post procedure instructions.  I will notify her of any pathology results.  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 Colpo showed normal appearing cervix, ectropion (small0. Clinically normal. Performed ECC with cytobrush per ASCCP guidelines and will also get HPV testing on that. If normal, rec pap 1 year ([facility] pt so we will be happy to do pap next year. Told her how to schedule this).

## 2014-10-12 LAB — CYTOLOGY - PAP

## 2014-10-12 NOTE — Addendum Note (Signed)
Addended byDenny Levy: Stelios Kirby L on: 10/12/2014 02:08 PM   Modules accepted: Orders

## 2014-10-17 ENCOUNTER — Encounter: Payer: Self-pay | Admitting: Family Medicine

## 2014-10-17 DIAGNOSIS — IMO0002 Reserved for concepts with insufficient information to code with codable children: Secondary | ICD-10-CM | POA: Insufficient documentation

## 2014-11-05 ENCOUNTER — Ambulatory Visit: Payer: Self-pay | Attending: Internal Medicine

## 2014-12-31 ENCOUNTER — Ambulatory Visit: Payer: Self-pay | Admitting: Internal Medicine

## 2015-02-06 ENCOUNTER — Ambulatory Visit: Payer: Self-pay | Attending: Internal Medicine

## 2015-03-04 ENCOUNTER — Ambulatory Visit: Payer: Self-pay | Attending: Internal Medicine | Admitting: Internal Medicine

## 2015-03-04 ENCOUNTER — Encounter: Payer: Self-pay | Admitting: Internal Medicine

## 2015-03-04 VITALS — BP 115/78 | HR 62 | Wt 107.2 lb

## 2015-03-04 DIAGNOSIS — Z3041 Encounter for surveillance of contraceptive pills: Secondary | ICD-10-CM

## 2015-03-04 MED ORDER — MONONESSA 0.25-35 MG-MCG PO TABS: ORAL_TABLET | ORAL | Status: DC

## 2015-03-04 NOTE — Progress Notes (Signed)
Pt is here to discuss birth control pills.

## 2015-03-04 NOTE — Progress Notes (Signed)
Patient ID: Nichole Hughes, female   DOB: 21-Jul-1986, 29 y.o.   MRN: [id number]  CC: medication refills  HPI: Nichole Hughes is a 29 y.o. female here today for a follow up visit. Patient reports that she is here for a refill of her birth control pills. She has no complaints with the medication. She would like to continue on the same medication if possible.  Patient has No headache, No chest pain, No abdominal pain - No Nausea, No new weakness tingling or numbness, No Cough - SOB.  No Known Allergies No past medical history on file. Current Outpatient Prescriptions on File Prior to Visit  Medication Sig Dispense Refill  . guaiFENesin (MUCINEX) 600 MG 12 hr tablet Take 2 tablets (1,200 mg total) by mouth 2 (two) times daily as needed for congestion. (Patient not taking: Reported on 09/17/2014) 60 tablet 0  . meloxicam (MOBIC) 15 MG tablet Take 1 tablet (15 mg total) by mouth daily. (Patient not taking: Reported on 09/17/2014) 15 tablet 0  . metroNIDAZOLE (FLAGYL) 500 MG tablet Take 1 tablet (500 mg total) by mouth 2 (two) times daily. (Patient not taking: Reported on 03/04/2015) 14 tablet 0  . ondansetron (ZOFRAN ODT) 4 MG disintegrating tablet Take 1 tablet (4 mg total) by mouth every 6 (six) hours as needed for nausea. (Patient not taking: Reported on 09/17/2014) 45 tablet 1  . traMADol (ULTRAM) 50 MG tablet Take 1 tablet (50 mg total) by mouth every 8 (eight) hours as needed for pain. (Patient not taking: Reported on 09/17/2014) 45 tablet 0   No current facility-administered medications on file prior to visit.   No family history on file. History   Social History  . Marital Status: Single    Spouse Name: N/A  . Number of Children: 0  . Years of Education: N/A   Occupational History  .      [place]   Social History Main Topics  . Smoking status: Never Smoker   . Smokeless tobacco: Not on file  . Alcohol Use: No  . Drug Use: No  . Sexual Activity: Not on file    Other Topics Concern  . Not on file   Social History Narrative    Review of Systems: See HPI  Objective:   Filed Vitals:   03/04/15 1135  BP: 115/78  Pulse: 62    Physical Exam  Cardiovascular: Normal rate, regular rhythm and normal heart sounds.   Pulmonary/Chest: Effort normal and breath sounds normal.     Lab Results  Component Value Date   WBC 8.3 10/14/2012   HGB 13.3 10/14/2012   HCT 39.8 10/14/2012   MCV 94.6 10/14/2012   PLT 275 10/14/2012   Lab Results  Component Value Date   CREATININE 0.50 09/23/2012   BUN 9 09/23/2012   NA 140 09/23/2012   K 3.8 09/23/2012   CL 99 09/23/2012   CO2 24 09/23/2012    No results found for: HGBA1C Lipid Panel  No results found for: CHOL, TRIG, HDL, CHOLHDL, VLDL, LDLCALC     Assessment and plan:   Nichole Hughes was seen today for discuss.  Diagnoses and all orders for this visit:  Encounter for birth control pills maintenance Orders: -    Refill MONONESSA 0.25-35 MG-MCG tablet; Take 1 pills once daily  Due to language barrier, an interpreter was present during the history-taking and subsequent discussion (and for part of the physical exam) with this patient.  Return if symptoms worsen or fail to improve.  Holland Commons, NP-C [facility] [phone] 03/04/2015, 12:01 PM

## 2015-04-01 ENCOUNTER — Telehealth: Payer: Self-pay | Admitting: Internal Medicine

## 2015-04-01 NOTE — Telephone Encounter (Signed)
Pt. Would like to know if she can change her birth control....please advise patient

## 2015-04-05 NOTE — Telephone Encounter (Signed)
Patient called requesting birth control change, patient states she is completely out, please f/u

## 2015-06-12 ENCOUNTER — Encounter: Payer: Self-pay | Admitting: Internal Medicine

## 2015-06-12 ENCOUNTER — Ambulatory Visit: Payer: Self-pay | Attending: Internal Medicine | Admitting: Internal Medicine

## 2015-06-12 VITALS — BP 122/80 | HR 80 | Temp 98.0°F | Resp 16 | Wt 106.0 lb

## 2015-06-12 DIAGNOSIS — Z87410 Personal history of cervical dysplasia: Secondary | ICD-10-CM | POA: Insufficient documentation

## 2015-06-12 DIAGNOSIS — N898 Other specified noninflammatory disorders of vagina: Secondary | ICD-10-CM | POA: Insufficient documentation

## 2015-06-12 DIAGNOSIS — Z23 Encounter for immunization: Secondary | ICD-10-CM | POA: Insufficient documentation

## 2015-06-12 DIAGNOSIS — Z3041 Encounter for surveillance of contraceptive pills: Secondary | ICD-10-CM | POA: Insufficient documentation

## 2015-06-12 LAB — POCT URINALYSIS DIPSTICK
BILIRUBIN UA: NEGATIVE
GLUCOSE UA: NEGATIVE
Ketones, UA: NEGATIVE
Leukocytes, UA: NEGATIVE
Nitrite, UA: NEGATIVE
Protein, UA: NEGATIVE
SPEC GRAV UA: 1.01
Urobilinogen, UA: 0.2
pH, UA: 7

## 2015-06-12 LAB — POCT URINE PREGNANCY: Preg Test, Ur: NEGATIVE

## 2015-06-12 MED ORDER — NORGESTIMATE-ETH ESTRADIOL 0.25-35 MG-MCG PO TABS: 1.0000 | ORAL_TABLET | Freq: Every day | ORAL | Status: DC

## 2015-06-12 MED ORDER — METRONIDAZOLE 0.75 % VA GEL: 1.0000 | Freq: Every day | VAGINAL | Status: DC

## 2015-06-12 NOTE — Progress Notes (Signed)
   Subjective:    Patient ID: Nichole Hughes, female    DOB: 05-30-1986, 29 y.o.   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 Patient complaints of vaginal discharge, pain and fishy odor.   Vaginal Discharge The patient's primary symptoms include genital itching and vaginal discharge. This is a recurrent problem. The current episode started 1 to 4 weeks ago. The problem occurs constantly. The problem has been gradually worsening. The pain is mild. She is not pregnant. The vaginal discharge was mucoid and white. There has been no bleeding. She has not been passing clots. She has not been passing tissue. The symptoms are aggravated by intercourse. She has tried nothing for the symptoms. She is sexually active. She uses oral contraceptives for contraception. Her menstrual history has been irregular. There is no history of an abdominal surgery, a Cesarean section, an ectopic pregnancy, endometriosis, a gynecological surgery or herpes simplex.      Review of Systems  Constitutional: Negative.   Respiratory: Negative.   Cardiovascular: Negative.   Gastrointestinal: Negative.   Genitourinary: Positive for vaginal discharge and vaginal pain.  Psychiatric/Behavioral: Negative.    BP 122/80 mmHg  Pulse 80  Temp(Src) 98 F (36.7 C)  Resp 16  Wt 106 lb (48.081 kg)  SpO2 100%    Objective:   Physical Exam  Constitutional: She is oriented to person, place, and time. She appears well-developed and well-nourished.  Cardiovascular: Normal rate and regular rhythm.   Pulmonary/Chest: Effort normal and breath sounds normal.  Genitourinary: Uterus normal. Cervix exhibits no motion tenderness, no discharge and no friability. Vaginal discharge found.  Lymphadenopathy:       Right: No inguinal adenopathy present.       Left: No inguinal adenopathy present.  Neurological: She is alert and oriented to  person, place, and time.  Psychiatric: She has a normal mood and affect. Her behavior is normal. Judgment and thought content normal.      Assessment & Plan:  Vedika was seen today for vaginal discharge.  Diagnoses and all orders for this visit:  Vaginal discharge -     Urinalysis Dipstick -     Cervicovaginal ancillary only -     Begin metroNIDAZOLE (METROGEL VAGINAL) 0.75 % vaginal gel; Place 1 Applicatorful vaginally at bedtime. For 7 days---Patient instructed not to drink alcohol while using this medication.  Encounter for initial prescription of contraceptives -     POCT urine pregnancy -     Refill norgestimate-ethinyl estradiol (ORTHO-CYCLEN,SPRINTEC,PREVIFEM) 0.25-35 MG-MCG tablet; Take 1 tablet by mouth daily.  FLU shot received in office today   Patient instructed to return to the clinic if symptoms worsen or reoccur.   Stephanie Coup, RN, BSN, AGNP - Student [facility] 06/12/2015 5:35 PM

## 2015-06-12 NOTE — Patient Instructions (Signed)
Vaginosis bacteriana (Bacterial Vaginosis) La vaginosis bacteriana es una infeccin vaginal que perturba el equilibrio normal de las bacterias que se encuentran en la vagina. Es el resultado de un crecimiento excesivo de ciertas bacterias. Esta es la infeccin vaginal ms frecuente en mujeres en edad reproductiva. El tratamiento es importante para prevenir complicaciones, especialmente en mujeres embarazadas, dado que puede causar un parto prematuro. CAUSAS  La vaginosis bacteriana se origina por un aumento de bacterias nocivas que, generalmente, estn presentes en cantidades ms pequeas en la vagina. Varios tipos diferentes de bacterias pueden causar esta afeccin. Sin embargo, la causa de su desarrollo no se comprende totalmente. FACTORES DE RIESGO Ciertas actividades o comportamientos pueden exponerlo a un mayor riesgo de desarrollar vaginosis bacteriana, entre los que se incluyen:  Tener una nueva pareja sexual o mltiples parejas sexuales.  Las duchas vaginales  El uso del DIU (dispositivo intrauterino) como mtodo anticonceptivo. El contagio no se produce en baos, por ropas de cama, en piscinas o por contacto con objetos. SIGNOS Y SNTOMAS  Algunas mujeres que padecen vaginosis bacteriana no presentan signos ni sntomas. Los sntomas ms comunes son:  Secrecin vaginal de color grisceo.  Secrecin vaginal con olor similar al pescado, especialmente despus de mantener relaciones sexuales.  Picazn o sensacin de ardor en la vagina o la vulva.  Ardor o dolor al orinar. DIAGNSTICO  Su mdico analizar su historia clnica y le examinar la vagina para detectar signos de vaginosis bacteriana. Puede tomarle una muestra de flujo vaginal. Su mdico examinar esta muestra con un microscopio para controlar las bacterias y clulas anormales. Tambin puede realizarse un anlisis del pH vaginal.  TRATAMIENTO  La vaginosis bacteriana puede tratarse con antibiticos, en forma de comprimidos o  de crema vaginal. Puede indicarse una segunda tanda de antibiticos si la afeccin se repite despus del tratamiento.  INSTRUCCIONES PARA EL CUIDADO EN EL HOGAR   Tome solo medicamentos de venta libre o recetados, segn las indicaciones del mdico.  Si le han recetado antibiticos, tmelos como se le indic. Asegrese de que finaliza la prescripcin completa aunque se sienta mejor.  No mantenga relaciones sexuales hasta completar el tratamiento.  Comunique a sus compaeros sexuales que sufre una infeccin vaginal. Deben consultar a su mdico y recibir tratamiento si tienen problemas, como picazn o una erupcin cutnea leve.  Practique el sexo seguro usando preservativos y tenga un nico compaero sexual. SOLICITE ATENCIN MDICA SI:   Sus sntomas no mejoran despus de 3 das de tratamiento.  Aumenta la secrecin o el dolor.  Tiene fiebre. ASEGRESE DE QUE:   Comprende estas instrucciones.  Controlar su afeccin.  Recibir ayuda de inmediato si no mejora o si empeora. PARA OBTENER MS INFORMACIN  Centros para el control y la prevencin de enfermedades (Centers for Disease Control and Prevention, CDC): www.cdc.gov/std Asociacin Estadounidense de la Salud Sexual (American Sexual Health Association, SHA): www.ashastd.org  Document Released: 12/08/2007 Document Revised: 06/21/2013 ExitCare Patient Information 2015 [employer]. This information is not intended to replace advice given to you by your health care provider. Make sure you discuss any questions you have with your health care provider.  

## 2015-06-12 NOTE — Progress Notes (Signed)
Visual interpreter used Nichole Hughes ID# [id number] Patient complains of having vaginal discharge with some thick mucous and odor That started approximately one week ago

## 2015-06-14 LAB — CERVICOVAGINAL ANCILLARY ONLY
CHLAMYDIA, DNA PROBE: NEGATIVE
NEISSERIA GONORRHEA: NEGATIVE
Wet Prep (BD Affirm): POSITIVE — AB

## 2015-06-21 ENCOUNTER — Telehealth: Payer: Self-pay

## 2015-06-21 MED ORDER — METRONIDAZOLE 500 MG PO TABS: 500.0000 mg | ORAL_TABLET | Freq: Two times a day (BID) | ORAL | Status: DC

## 2015-06-21 NOTE — Telephone Encounter (Signed)
Interpreter line used Nichole Hughes [id number] Patient is aware of her pelvic exam results-BV Prescription sent to [facility] on file

## 2015-06-21 NOTE — Telephone Encounter (Signed)
-----   Message from Tandy Gaw, RN sent at 06/17/2015  2:00 PM EDT -----   ----- Message -----    From: Ambrose Finland, NP    Sent: 06/14/2015  11:15 AM      To: Tandy Gaw, RN  Patient positive for Bacterial Vaginosis . Please explain this is not a STD, but a imbalance of the vaginal pH. Please send Flagyl 500 mg BID for 7 days. No refills, no alcohol while on this medication.

## 2015-10-28 ENCOUNTER — Ambulatory Visit: Payer: Self-pay | Attending: Internal Medicine | Admitting: Internal Medicine

## 2015-10-28 ENCOUNTER — Encounter: Payer: Self-pay | Admitting: Internal Medicine

## 2015-10-28 VITALS — BP 113/71 | HR 79 | Temp 97.2°F | Resp 16 | Ht 60.0 in | Wt 115.0 lb

## 2015-10-28 DIAGNOSIS — Z3041 Encounter for surveillance of contraceptive pills: Secondary | ICD-10-CM

## 2015-10-28 DIAGNOSIS — Z0000 Encounter for general adult medical examination without abnormal findings: Secondary | ICD-10-CM

## 2015-10-28 LAB — POCT URINALYSIS DIPSTICK
Bilirubin, UA: NEGATIVE
Glucose, UA: NEGATIVE
Ketones, UA: NEGATIVE
LEUKOCYTES UA: NEGATIVE
Nitrite, UA: NEGATIVE
PROTEIN UA: NEGATIVE
Spec Grav, UA: >=1.03
UROBILINOGEN UA: 0.2
pH, UA: 5.5

## 2015-10-28 MED ORDER — NORGESTIMATE-ETH ESTRADIOL 0.25-35 MG-MCG PO TABS: 1.0000 | ORAL_TABLET | Freq: Every day | ORAL | Status: DC

## 2015-10-28 NOTE — Progress Notes (Signed)
Nichole Hughes, is a 30 y.o. female  ZOX:[id number]  WJX:[id number]  DOB - September 27, 1985  CC:  Chief Complaint  Patient presents with  . Annual Exam    HPI: Nichole Hughes is a 30 y.o. female here today for physical and medication refills. 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 Patient is sexually active, contraception with oral birth control and seeks refill today. Patient had questions about stopping birth control and time line surrounding trying to get pregnant. Patient was counseled and encouraged to start folic acid or prenatal vitamins 3-6 months before desired pregnancy. Patient had no other complaints. Patient has No headache, No chest pain, No abdominal pain - No Nausea, No new weakness tingling or numbness, No Cough - SOB. Interpreter phone used for this visit.   No Known Allergies History reviewed. No pertinent past medical history. Current Outpatient Prescriptions on File Prior to Visit  Medication Sig Dispense Refill  . guaiFENesin (MUCINEX) 600 MG 12 hr tablet Take 2 tablets (1,200 mg total) by mouth 2 (two) times daily as needed for congestion. (Patient not taking: Reported on 09/17/2014) 60 tablet 0  . meloxicam (MOBIC) 15 MG tablet Take 1 tablet (15 mg total) by mouth daily. (Patient not taking: Reported on 09/17/2014) 15 tablet 0  . metroNIDAZOLE (FLAGYL) 500 MG tablet Take 1 tablet (500 mg total) by mouth 2 (two) times daily. 14 tablet 0  . metroNIDAZOLE (METROGEL VAGINAL) 0.75 % vaginal gel Place 1 Applicatorful vaginally at bedtime. For 7 days 70 g 0  . norgestimate-ethinyl estradiol (ORTHO-CYCLEN,SPRINTEC,PREVIFEM) 0.25-35 MG-MCG tablet Take 1 tablet by mouth daily. 6 Package 4  . ondansetron (ZOFRAN ODT) 4 MG disintegrating tablet Take 1 tablet (4 mg total) by mouth every 6 (six) hours as needed for  nausea. (Patient not taking: Reported on 09/17/2014) 45 tablet 1  . traMADol (ULTRAM) 50 MG tablet Take 1 tablet (50 mg total) by mouth every 8 (eight) hours as needed for pain. (Patient not taking: Reported on 09/17/2014) 45 tablet 0   No current facility-administered medications on file prior to visit.   History reviewed. No pertinent family history. Social History   Social History  . Marital Status: Single    Spouse Name: N/A  . Number of Children: 0  . Years of Education: N/A   Occupational History  .      [employer]   Social History Main Topics  . Smoking status: Never Smoker   . Smokeless tobacco: Not on file  . Alcohol Use: No  . Drug Use: No  . Sexual Activity: Not on file   Other Topics Concern  . Not on file   Social History Narrative    Review of Systems: Constitutional: Negative for fever, chills, diaphoresis, activity change, appetite change and fatigue. HUT: Negative for ear pain, nosebleeds, congestion, facial swelling, rhinorrhea, neck pain, neck stiffness and ear discharge.  Eyes: Negative for pain, discharge, redness, itching and visual disturbance. Respiratory: Negative for cough, choking, chest tightness, shortness of breath, wheezing and stridor.  Cardiovascular: Negative for chest pain, palpitations and leg swelling. Gastrointestinal: Negative for abdominal distention. Genitourinary: Negative for dysuria, urgency, frequency, hematuria, flank pain, decreased urine volume, difficulty urinating and dyspareunia.  Musculoskeletal: Negative for back pain, joint swelling, arthralgia and gait problem. Neurological: Negative for dizziness, tremors, seizures, syncope, facial asymmetry, speech difficulty, weakness, light-headedness, numbness and headaches.  Hematological: Negative for adenopathy. Does not bruise/bleed easily. Psychiatric/Behavioral: Negative for hallucinations, behavioral problems, confusion, dysphoric mood, decreased concentration and agitation.      Objective:   Filed Vitals:   10/28/15 1409  BP: 113/71  Pulse: 79  Temp: 97.2 F (36.2 C)  Resp: 16   Physical Exam   Physical Exam: Constitutional: Patient appears well-developed and well-nourished. No distress. HENT: Normocephalic, atraumatic, External right and left ear normal. Oropharynx is clear and moist.  Eyes: Conjunctivae and EOM are normal. PERRLA, no scleral icterus. Neck: Normal ROM. Neck supple. No JVD. No tracheal deviation. No thyromegaly. CVS: RRR, S1/S2 +, no murmurs, no gallops, no carotid bruit.  GU: vaginal discharge present, moderate amount - white, no odor. Yellow mucous discharge present from cervix, and surface of cervix appears yellow and friable. Pulmonary: Effort and breath sounds normal, no stridor, rhonchi, wheezes, rales.  Abdominal: Soft. BS +, no distension, tenderness, rebound or guarding.  Musculoskeletal: Normal range of motion. No edema and no tenderness.  Lymphadenopathy: No lymphadenopathy noted. Neuro: Alert and Oriented x 4. Skin: Skin is warm and dry. No rash noted. Not diaphoretic. No erythema. No pallor. Psychiatric: Normal mood and affect. Behavior, judgment, thought content normal.  Lab Results  Component Value Date   WBC 8.3 10/14/2012   HGB 13.3 10/14/2012   HCT 39.8 10/14/2012   MCV 94.6 10/14/2012   PLT 275 10/14/2012   Lab Results  Component Value Date   CREATININE 0.50 09/23/2012   BUN 9 09/23/2012   NA 140 09/23/2012   K 3.8 09/23/2012   CL 99 09/23/2012   CO2 24 09/23/2012    No results found for: HGBA1C Lipid Panel  No results found for: CHOL, TRIG, HDL, CHOLHDL, VLDL, LDLCALC     Assessment and plan:   Maely was seen today for annual exam.  Diagnoses and all orders for this visit:  Annual physical exam -     POCT urinalysis dipstick -     Cytology - PAP [place] -     TSH; Future -     Basic Metabolic Panel; Future -     Lipid panel; Future -     Vitamin D, 25-hydroxy; Future  Encounter for  birth control pills maintenance -     norgestimate-ethinyl estradiol (ORTHO-CYCLEN,SPRINTEC,PREVIFEM) 0.25-35 MG-MCG tablet; Take 1 tablet by mouth daily.    Return in about 1 day (around 10/29/2015) for Lab Visit.    Stephanie Coup, AGNP-Student [facility] [phone] 10/28/2015, 3:11 PM

## 2015-10-28 NOTE — Progress Notes (Signed)
Interpreter used Nichole Hughes ID# [id number] Patient here for her annual physical

## 2015-10-29 LAB — CERVICOVAGINAL ANCILLARY ONLY
CHLAMYDIA, DNA PROBE: NEGATIVE
Neisseria Gonorrhea: NEGATIVE

## 2015-10-30 ENCOUNTER — Telehealth: Payer: Self-pay

## 2015-10-30 LAB — CERVICOVAGINAL ANCILLARY ONLY: Wet Prep (BD Affirm): POSITIVE — AB

## 2015-10-30 MED ORDER — FLUCONAZOLE 150 MG PO TABS: 150.0000 mg | ORAL_TABLET | Freq: Once | ORAL | Status: DC

## 2015-10-30 NOTE — Telephone Encounter (Signed)
-----   Message from Ambrose Finland, NP sent at 10/30/2015 12:05 PM EST ----- Positive for yeast. Please send diflucan 150 mg to take PO once. Send 1 tablet with 1 refill. May repeat in one week if no improvement

## 2015-10-30 NOTE — Telephone Encounter (Signed)
-----   Message from Ambrose Finland, NP sent at 10/29/2015  4:39 PM EST ----- No STD

## 2015-10-30 NOTE — Telephone Encounter (Signed)
Interpreter line used Tsion ID# [id number] Interpreter tried two times to contact patient Message stated the person you are trying to reach is not accepting Incoming calls

## 2015-10-30 NOTE — Telephone Encounter (Signed)
Interpreter line used Simonne Come [phone] Spoke with patient and she is aware of her results

## 2015-10-31 ENCOUNTER — Ambulatory Visit: Payer: Self-pay | Attending: Internal Medicine

## 2015-10-31 DIAGNOSIS — Z0000 Encounter for general adult medical examination without abnormal findings: Secondary | ICD-10-CM

## 2015-10-31 LAB — LIPID PANEL
Cholesterol: 194 mg/dL (ref 125–200)
HDL: 70 mg/dL (ref >=46)
LDL CALC: 113 mg/dL (ref <130)
Total CHOL/HDL Ratio: 2.8 Ratio (ref <=5.0)
Triglycerides: 54 mg/dL (ref <150)
VLDL: 11 mg/dL (ref <30)

## 2015-10-31 LAB — TSH: TSH: 1.47 mIU/L

## 2015-10-31 LAB — BASIC METABOLIC PANEL
BUN: 12 mg/dL (ref 7–25)
CHLORIDE: 105 mmol/L (ref 98–110)
CO2: 23 mmol/L (ref 20–31)
CREATININE: 0.63 mg/dL (ref 0.50–1.10)
Calcium: 9.2 mg/dL (ref 8.6–10.2)
Glucose, Bld: 90 mg/dL (ref 65–99)
Potassium: 5 mmol/L (ref 3.5–5.3)
Sodium: 137 mmol/L (ref 135–146)

## 2015-11-01 ENCOUNTER — Telehealth: Payer: Self-pay

## 2015-11-01 LAB — VITAMIN D 25 HYDROXY (VIT D DEFICIENCY, FRACTURES): VIT D 25 HYDROXY: 30 ng/mL (ref 30–100)

## 2015-11-01 LAB — CYTOLOGY - PAP

## 2015-11-01 NOTE — Telephone Encounter (Signed)
-----   Message from Ambrose Finland, NP sent at 11/01/2015  9:23 AM EST ----- Labs normal except elevated cholesterol. Please go over specific diet changes with patient and exercise

## 2015-11-01 NOTE — Telephone Encounter (Signed)
Interpreter line used Nelva Bush ID# [phone] Tried to contact patient Patient not available Unable to leave voice mail No answering machine

## 2015-11-04 ENCOUNTER — Telehealth: Payer: Self-pay

## 2015-11-04 NOTE — Telephone Encounter (Signed)
-----   Message from Ambrose Finland, NP sent at 11/04/2015  8:37 AM EST ----- Cytology normal. Will repeat pap in 3 years

## 2015-11-04 NOTE — Telephone Encounter (Signed)
Interpreter line used Nichole Hughes [id number] Patient is aware of her normal cytology results

## 2016-01-10 IMAGING — CR DG FINGER THUMB 2+V*L*
3 series · 3 of 3 positions shown · non-contrast
Comparison: None.

CLINICAL DATA: Injury.  Pain.

EXAM:
LEFT THUMB 2+V

[view not recorded (1 of 3)]
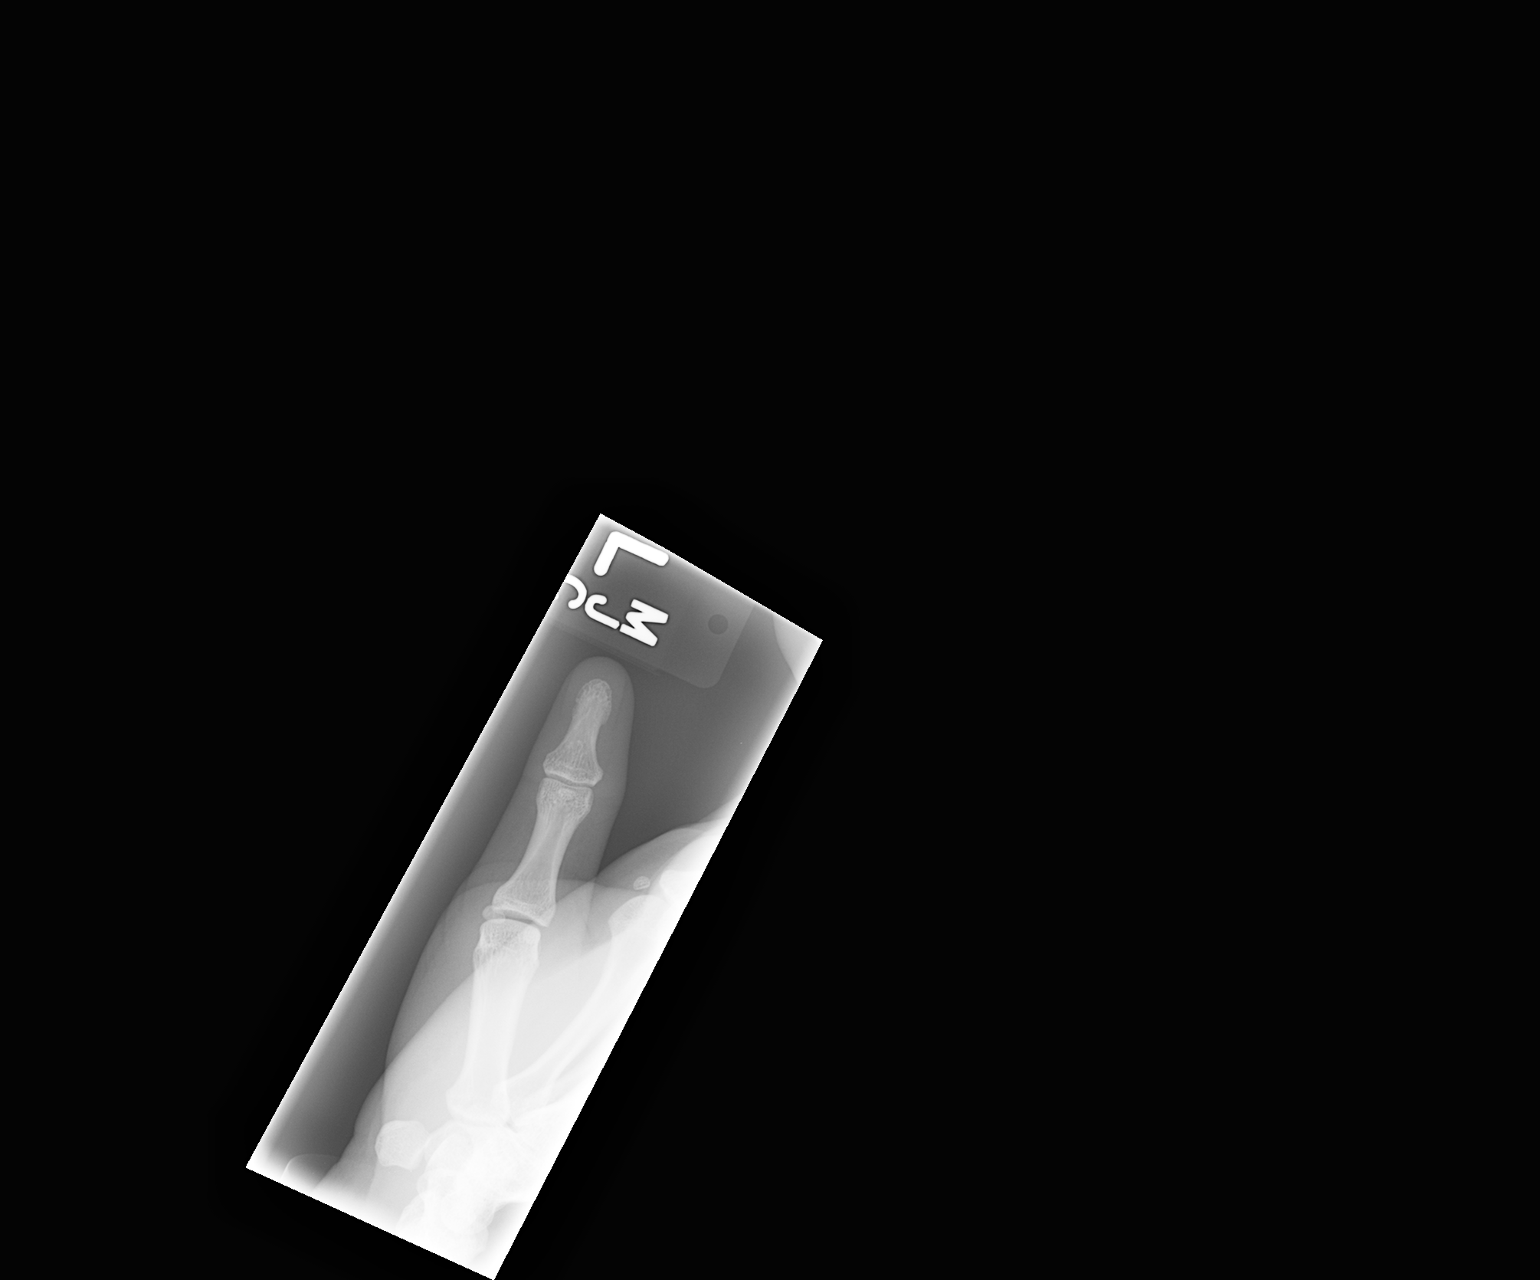

[view not recorded (2 of 3)]
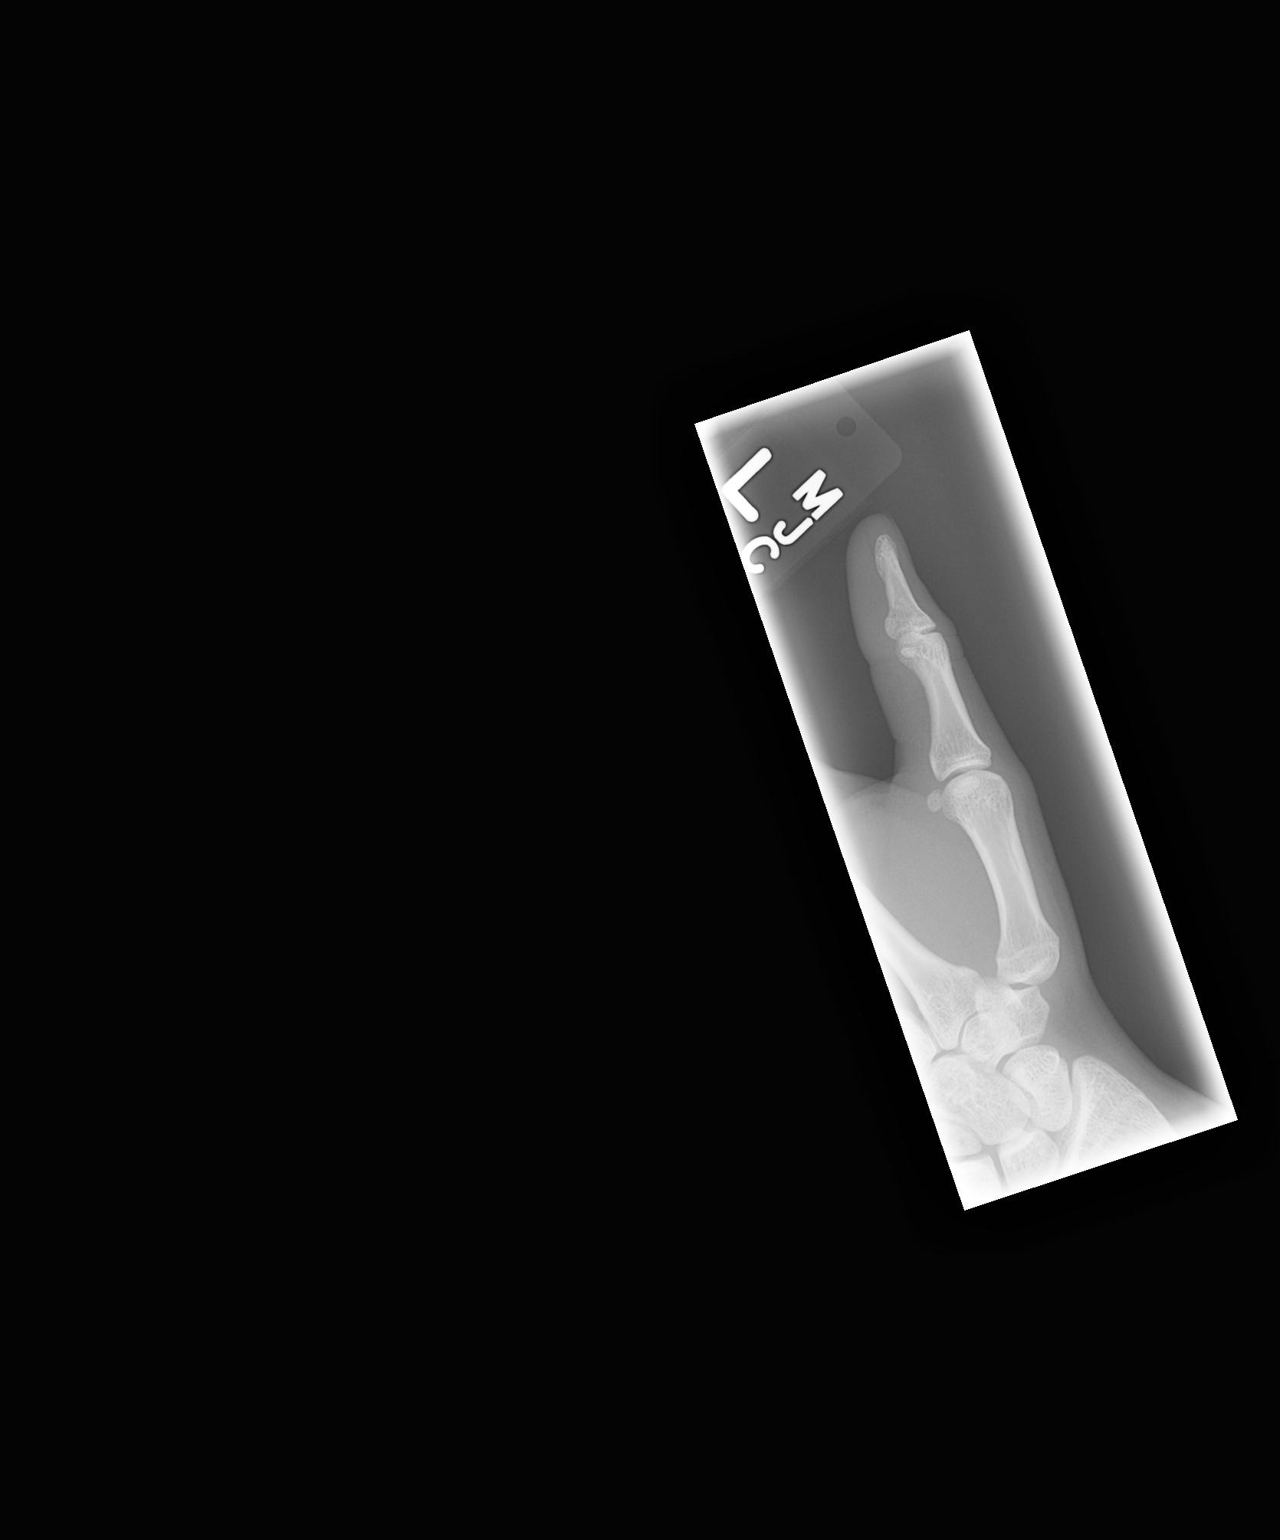

[view not recorded (3 of 3)]
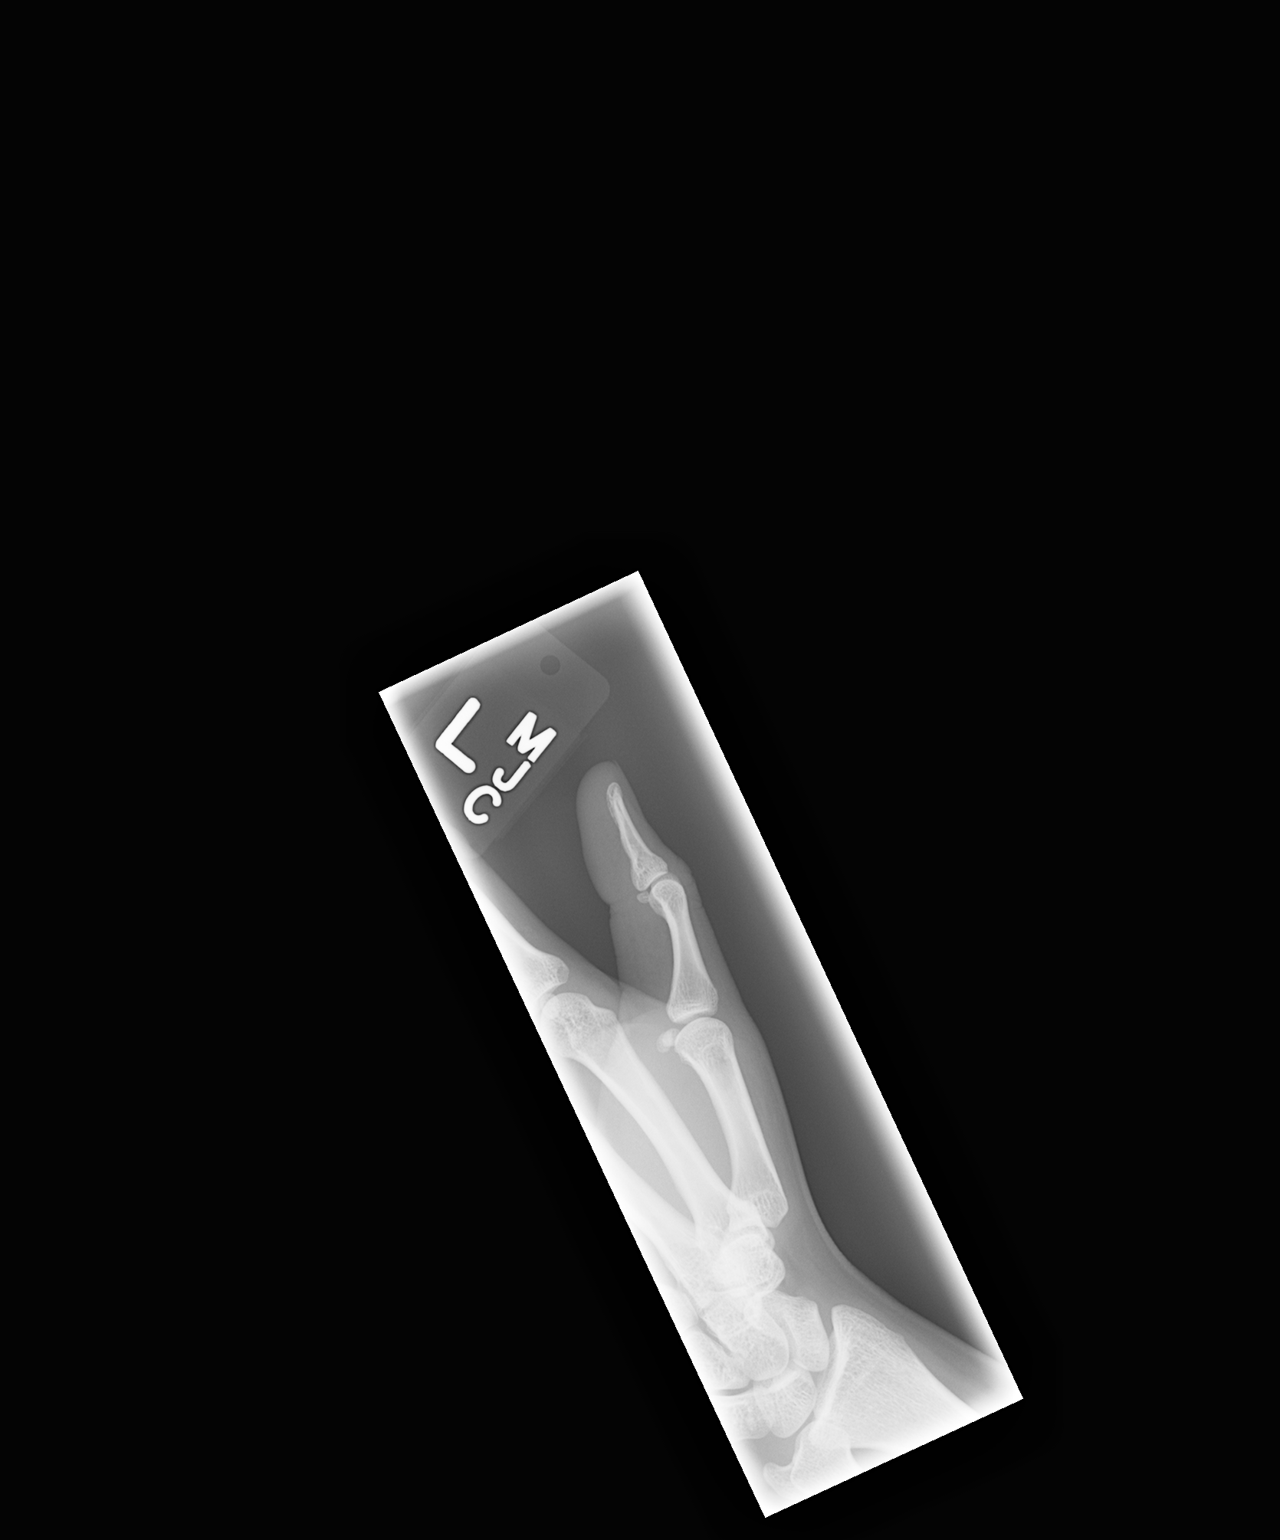

[3 of 3 positions shown; findings below may reference images not displayed]

FINDINGS: There is no evidence of fracture or dislocation. There is no
evidence of arthropathy or other focal bone abnormality. Soft
tissues are unremarkable
IMPRESSION: Negative.

## 2016-04-15 ENCOUNTER — Ambulatory Visit (HOSPITAL_COMMUNITY)
Admission: EM | Admit: 2016-04-15 | Discharge: 2016-04-15 | Disposition: A | Discharge status: Home / Self Care | Payer: Self-pay | Attending: Family Medicine | Admitting: Family Medicine

## 2016-04-15 ENCOUNTER — Encounter (HOSPITAL_COMMUNITY): Payer: Self-pay | Admitting: Nurse Practitioner

## 2016-04-15 DIAGNOSIS — T148 Other injury of unspecified body region: Secondary | ICD-10-CM

## 2016-04-15 DIAGNOSIS — L089 Local infection of the skin and subcutaneous tissue, unspecified: Secondary | ICD-10-CM

## 2016-04-15 DIAGNOSIS — T148XXA Other injury of unspecified body region, initial encounter: Principal | ICD-10-CM

## 2016-04-15 MED ORDER — CEPHALEXIN 500 MG PO CAPS: 500.0000 mg | ORAL_CAPSULE | Freq: Two times a day (BID) | ORAL | 0 refills | Status: AC
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
## 2016-04-15 NOTE — Discharge Instructions (Signed)
Stop the Mederma. Start taking the antibiotic. Keep the wound clean with daily gentle cleansing using soap and water. Cover the wound with a dressing and change the dressing daily. Follow up with a primary care physician if the wound does not improve.
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
## 2016-04-22 ENCOUNTER — Ambulatory Visit: Payer: Self-pay | Attending: Internal Medicine

## 2016-05-08 ENCOUNTER — Ambulatory Visit: Payer: Self-pay | Attending: Internal Medicine | Admitting: Physician Assistant

## 2016-05-08 VITALS — BP 114/76 | HR 65 | Temp 98.2°F | Resp 16 | Wt 119.4 lb

## 2016-05-08 DIAGNOSIS — Z3009 Encounter for other general counseling and advice on contraception: Secondary | ICD-10-CM

## 2016-05-08 DIAGNOSIS — N912 Amenorrhea, unspecified: Secondary | ICD-10-CM

## 2016-05-08 DIAGNOSIS — Z308 Encounter for other contraceptive management: Secondary | ICD-10-CM | POA: Insufficient documentation

## 2016-05-08 LAB — CBC WITH DIFFERENTIAL/PLATELET
BASOS ABS: 0 {cells}/uL (ref 0–200)
Basophils Relative: 0 %
Eosinophils Absolute: 74 cells/uL (ref 15–500)
Eosinophils Relative: 1 %
HEMATOCRIT: 41.8 % (ref 35.0–45.0)
HEMOGLOBIN: 14 g/dL (ref 11.7–15.5)
LYMPHS PCT: 26 %
Lymphs Abs: 1924 cells/uL (ref 850–3900)
MCH: 31.5 pg (ref 27.0–33.0)
MCHC: 33.5 g/dL (ref 32.0–36.0)
MCV: 93.9 fL (ref 80.0–100.0)
MONO ABS: 370 {cells}/uL (ref 200–950)
MPV: 9.2 fL (ref 7.5–12.5)
Monocytes Relative: 5 %
NEUTROS PCT: 68 %
Neutro Abs: 5032 cells/uL (ref 1500–7800)
Platelets: 401 10*3/uL — ABNORMAL HIGH (ref 140–400)
RBC: 4.45 MIL/uL (ref 3.80–5.10)
RDW: 12.4 % (ref 11.0–15.0)
WBC: 7.4 10*3/uL (ref 3.8–10.8)

## 2016-05-08 LAB — TSH: TSH: 1.75 mIU/L

## 2016-05-08 LAB — BASIC METABOLIC PANEL
BUN: 8 mg/dL (ref 7–25)
CALCIUM: 9.4 mg/dL (ref 8.6–10.2)
CO2: 23 mmol/L (ref 20–31)
CREATININE: 0.65 mg/dL (ref 0.50–1.10)
Chloride: 105 mmol/L (ref 98–110)
GLUCOSE: 82 mg/dL (ref 65–99)
POTASSIUM: 4.5 mmol/L (ref 3.5–5.3)
Sodium: 138 mmol/L (ref 135–146)

## 2016-05-08 LAB — POCT URINE PREGNANCY: Preg Test, Ur: NEGATIVE

## 2016-05-08 NOTE — Progress Notes (Signed)
Nichole Hughes, is a 30 y.o. female  ZOX:[id number]  [id number]:[id number]  DOB - 07/29/1986  Subjective:  Chief Complaint and HPI: Nichole Hughes is a 30 y.o. female here today for amenorrhea.  [employer] Interpreter used.  Rebecca(lost contact)then Kassandra.  LMP 03/25/2016.  Home pregnancy test negative last week.  She has been on OCP for about 1 year.  She has noticed that her periods have become lighter and lighter over time and this month she didn't have a period at all.  She did have some cramping that felt as though she was going to start her period.  She has continued to take her OCP consistently. She also c/o fatigue the last few days.  After she finishes this pill pack, her desire is to stop the OCP and become pregnant.   No vaginal discharge or pelvis pain.  Denies STD risk factors.   ROS:   Constitutional:  No f/c, No night sweats, No unexplained weight loss. EENT:  No vision changes, No blurry vision, No hearing changes. No mouth, throat, or ear problems.  Respiratory: No cough, No SOB Cardiac: No CP, no palpitations GI:  No abd pain, No N/V/D. GU: No Urinary s/sx Musculoskeletal: No joint pain Neuro: No headache, no dizziness, no motor weakness.  Skin: No rash Endocrine:  No polydipsia. No polyuria.  Psych: Denies SI/HI    ALLERGIES: No Known Allergies  PAST MEDICAL HISTORY: No past medical history on file.  MEDICATIONS AT HOME: Prior to Admission medications   Medication Sig Start Date End Date Taking? Authorizing Provider  metroNIDAZOLE (METROGEL VAGINAL) 0.75 % vaginal gel Place 1 Applicatorful vaginally at bedtime. For 7 days Patient not taking: Reported on 05/08/2016 06/12/15   Ambrose Finland, NP     Objective:  EXAM:   Vitals:   05/08/16 1002  BP: 114/76  Pulse: 65  Resp: 16  Temp: 98.2 F (36.8 C)  TempSrc: Oral  SpO2: 98%  Weight: 119 lb 6.4 oz (54.2 kg)    General appearance : A&OX3. NAD. Non-toxic-appearing HEENT: Atraumatic and  Normocephalic.  PERRLA. EOM intact.   Neck: supple, no JVD. No cervical lymphadenopathy. No thyromegaly Chest/Lungs:  Breathing-non-labored, Good air entry bilaterally, breath sounds normal without rales, rhonchi, or wheezing  CVS: S1 S2 regular, no murmurs, gallops, rubs  Abdomen: Bowel sounds present, Non tender and not distended with no gaurding, rigidity or rebound. Extremities: Bilateral Lower Ext shows no edema, both legs are warm to touch with = pulse throughout Neurology:  CN II-XII grossly intact, Non focal.   Psych:  TP linear. J/I WNL. Normal speech. Appropriate eye contact and affect.  Skin:  No Rash  Data Review No results found for: HGBA1C   Assessment & Plan   1. Amenorrhea Likely OCP related and periods lightening over time- - POCT urine pregnancy-negative here today - check TSH - CBC with Differential/Platelet - Basic metabolic panel  2. Family planning advice Recommended-Condoms X 3 months after stopping OCP and start PNV.  Condoms given.    Patient have been counseled extensively about nutrition and exercise  Return in about 6 months (around 11/08/2016) for re-assign to PCP.;  Sooner if needed.   The patient was given clear instructions to go to ER or return to medical center if symptoms don't improve, worsen or new problems develop. The patient verbalized understanding. The patient was told to call to get lab results if they haven't heard anything in the next week.     Georgian Co, PA-C [place]  [facility] [place], [place] [phone]   05/08/2016, 10:34 AM

## 2016-05-08 NOTE — Patient Instructions (Signed)
Prenatal vitamins-1 daily

## 2016-05-08 NOTE — Progress Notes (Signed)
Pt is in the office today for not having periods  Pt states she has been having the symptoms but she hasn't had one Pt states she is not regular  Pt states she hasn't had a period this month  Pt states she has had unprotected sex last month but she is on birth control pills

## 2016-05-11 ENCOUNTER — Telehealth: Payer: Self-pay

## 2016-05-11 NOTE — Telephone Encounter (Signed)
[employer] Rexford MausGerardo AV:[id number] Contacted pt to go over lab results pt is aware of lab results and doesn't have any questions or concerns

## 2016-05-25 ENCOUNTER — Telehealth: Payer: Self-pay | Admitting: Internal Medicine

## 2016-05-25 NOTE — Telephone Encounter (Signed)
Pt called to speak with you regarding the status of her application. Pt stated that she has a dentist appointment this Wednesday and needs OC before then. Please follow up.  Thank you.

## 2016-07-10 ENCOUNTER — Ambulatory Visit: Payer: Self-pay | Attending: Internal Medicine | Admitting: Pharmacist

## 2016-07-10 DIAGNOSIS — Z23 Encounter for immunization: Secondary | ICD-10-CM

## 2016-08-17 ENCOUNTER — Ambulatory Visit: Payer: Self-pay | Attending: Internal Medicine | Admitting: *Deleted

## 2016-08-17 VITALS — BP 108/71 | HR 61 | Temp 97.4°F

## 2016-08-17 DIAGNOSIS — N912 Amenorrhea, unspecified: Secondary | ICD-10-CM

## 2016-08-17 DIAGNOSIS — R112 Nausea with vomiting, unspecified: Secondary | ICD-10-CM | POA: Insufficient documentation

## 2016-08-17 LAB — POCT URINE PREGNANCY: PREG TEST UR: POSITIVE — AB

## 2016-08-17 NOTE — Patient Instructions (Addendum)
Your pregnacy test was positive today. Please get Vitamin B 6 tablets over the counter. Take 25 mg every 8 hours as needed for nausea/ vomiting.   Also get prenatal vitamins.  Follow up here with provider.  Continue to drink plenty of fluids. You may eat saltine crackers to help with nausea.

## 2016-08-17 NOTE — Progress Notes (Signed)
Pt arrived to [facility] who Speaks little [place]. Spanish interpreter assisted with visit. Pt c/o nausea and no appetite for 2 weeks. She had one emesis episode Friday. She stated LMP was 07/07/2016. Denies abdominal pain or bleeding.   Pt urine HCG positive. Dr. Armen PickupFunches aware. Instructions below per Dr. Armen PickupFunches.    Please get Vitamin B 6 tablets over the counter. Take 25 mg every 8 hours as needed for nausea/ vomiting.   Also get prenatal vitamins.  Follow up here with provider.  Continue to get plenty of fluids. You may eat saltine crackers to help with nausea.  [employer]:  Lars MageJuan: [id number] [name]: [phone]

## 2016-08-24 ENCOUNTER — Ambulatory Visit: Payer: Self-pay | Attending: Internal Medicine

## 2016-08-25 ENCOUNTER — Encounter: Payer: Self-pay | Admitting: Family Medicine

## 2016-08-25 ENCOUNTER — Ambulatory Visit: Payer: Self-pay | Attending: Family Medicine | Admitting: Family Medicine

## 2016-08-25 VITALS — BP 106/67 | HR 72 | Temp 98.4°F | Ht 62.0 in | Wt 122.0 lb

## 2016-08-25 DIAGNOSIS — L299 Pruritus, unspecified: Secondary | ICD-10-CM | POA: Insufficient documentation

## 2016-08-25 DIAGNOSIS — O210 Mild hyperemesis gravidarum: Secondary | ICD-10-CM | POA: Insufficient documentation

## 2016-08-25 DIAGNOSIS — Z3201 Encounter for pregnancy test, result positive: Secondary | ICD-10-CM | POA: Insufficient documentation

## 2016-08-25 LAB — POCT URINE PREGNANCY: Preg Test, Ur: POSITIVE — AB

## 2016-08-25 MED ORDER — DIPHENHYDRAMINE-ZINC ACETATE 1-0.1 % EX CREA: TOPICAL_CREAM | Freq: Three times a day (TID) | CUTANEOUS | 0 refills | Status: DC | PRN

## 2016-08-25 NOTE — Progress Notes (Signed)
   Subjective:  Patient ID: Nichole Hughes, female    DOB: 04/30/1986  Age: 30 y.o. MRN: [id number]  CC: Routine Prenatal Visit; Morning Sickness; and referral to OB   HPI Nichole Hughes presents for With a positive home pregnancy test. LMP was 07/07/16 and she endorses nausea, enlargement of her progress and reduced appetite. She is currently taking prenatal vitamins.  Complains of pruritus in the skin of her abdomen and like to have a cream for this. Denies presence of rash; has not used any new soaps or creams and denies allergies.  History reviewed. No pertinent past medical history.  History reviewed. No pertinent surgical history.    Outpatient Medications Prior to Visit  Medication Sig Dispense Refill  . metroNIDAZOLE (METROGEL VAGINAL) 0.75 % vaginal gel Place 1 Applicatorful vaginally at bedtime. For 7 days (Patient not taking: Reported on 05/08/2016) 70 g 0   No facility-administered medications prior to visit.     ROS Review of Systems  Constitutional: Negative for activity change and appetite change.  HENT: Negative for sinus pressure and sore throat.   Respiratory: Negative for chest tightness, shortness of breath and wheezing.   Cardiovascular: Negative for chest pain and palpitations.  Gastrointestinal: Positive for nausea. Negative for abdominal distention, abdominal pain and constipation.  Genitourinary: Negative.   Musculoskeletal: Negative.   Psychiatric/Behavioral: Negative for behavioral problems and dysphoric mood.    Objective:  BP 106/67 (BP Location: Right Arm, Patient Position: Sitting, Cuff Size: Small)   Pulse 72   Temp 98.4 F (36.9 C) (Oral)   Ht 5\' 2"  (1.575 m)   Wt 122 lb (55.3 kg)   LMP 07/07/2016   SpO2 99%   BMI 22.31 kg/m   BP/Weight 08/25/2016 08/17/2016 05/08/2016  Systolic BP 106 108 114  Diastolic BP 67 71 76  Wt. (Lbs) 122 - 119.4  BMI 22.31 - 23.32      Physical Exam  Constitutional: She is  oriented to person, place, and time. She appears well-developed and well-nourished.  Cardiovascular: Normal rate, normal heart sounds and intact distal pulses.   No murmur heard. Pulmonary/Chest: Effort normal and breath sounds normal. She has no wheezes. She has no rales. She exhibits no tenderness.  Abdominal: Soft. Bowel sounds are normal. She exhibits no distension and no mass. There is no tenderness.  Musculoskeletal: Normal range of motion.  Neurological: She is alert and oriented to person, place, and time.     Assessment & Plan:   1. Positive pregnancy test Continue prenatal vitamins Referral coordinator to come discuss application for Medicaid with patient to facilitate referral - Ambulatory referral to Obstetrics / Gynecology  2. Pruritus - diphenhydrAMINE-zinc acetate (BENADRYL ITCH STOPPING) cream; Apply topically 3 (three) times daily as needed for itching.  Dispense: 28.3 g; Refill: 0   Meds ordered this encounter  Medications  . diphenhydrAMINE-zinc acetate (BENADRYL ITCH STOPPING) cream    Sig: Apply topically 3 (three) times daily as needed for itching.    Dispense:  28.3 g    Refill:  0    Follow-up: Return if symptoms worsen or fail to improve.   Jaclyn ShaggyEnobong Amao MD

## 2016-08-25 NOTE — Addendum Note (Signed)
Addended by: Quentin AngstLIEB, Raynell Upton E on: 08/25/2016 12:53 PM   Modules accepted: Orders

## 2016-09-14 NOTE — L&D Delivery Note (Signed)
31 y.o. G1P0 at 5813w5d admitted for SOL delivered a viable female infant at 1739 in cephalic, LOA position. No nuchal cord. Right anterior shoulder delivered with ease. 60 sec delayed cord clamping. Cord clamped x2 and cut. Placenta delivered spontaneously intact, with 3VC. Fundus firm on exam with massage and pitocin. Good hemostasis noted.  Anesthesia: Epidural Laceration: 2nd degree Suture: 3.0 Vicryl rapide Good hemostasis noted. EBL: 150cc  Mom and baby recovering in LDR.    Apgars: APGAR (1 MIN): 9   APGAR (5 MINS): 9   Weight: Pending skin to skin  Sponge and instrument count were correct x2. Placenta sent to L&D. Boy (no circumcision), breast feeding, OCP's for contraception.  Nichole Shirley, DO [facility] Resident PGY-1 04/08/2017 6:30 PM  Midwife attestation: I was gloved and present for delivery in its entirety and I agree with the above resident's note.  Donette LarryMelanie Brittany Osier, CNM 6:47 PM

## 2016-09-24 LAB — OB RESULTS CONSOLE ABO/RH: RH Type: POSITIVE

## 2016-09-24 LAB — OB RESULTS CONSOLE RPR: RPR: NONREACTIVE

## 2016-09-24 LAB — OB RESULTS CONSOLE GC/CHLAMYDIA
CHLAMYDIA, DNA PROBE: NEGATIVE
GC PROBE AMP, GENITAL: NEGATIVE

## 2016-09-24 LAB — OB RESULTS CONSOLE HEPATITIS B SURFACE ANTIGEN: HEP B S AG: NEGATIVE

## 2016-09-24 LAB — OB RESULTS CONSOLE HIV ANTIBODY (ROUTINE TESTING): HIV: NONREACTIVE

## 2016-09-24 LAB — OB RESULTS CONSOLE RUBELLA ANTIBODY, IGM: RUBELLA: IMMUNE

## 2016-09-24 LAB — OB RESULTS CONSOLE ANTIBODY SCREEN: ANTIBODY SCREEN: NEGATIVE

## 2016-09-25 ENCOUNTER — Other Ambulatory Visit (HOSPITAL_COMMUNITY): Payer: Self-pay | Admitting: Nurse Practitioner

## 2016-09-25 DIAGNOSIS — Z3A13 13 weeks gestation of pregnancy: Secondary | ICD-10-CM

## 2016-09-25 DIAGNOSIS — Z3682 Encounter for antenatal screening for nuchal translucency: Secondary | ICD-10-CM

## 2016-10-02 ENCOUNTER — Encounter (HOSPITAL_COMMUNITY): Payer: Self-pay | Admitting: *Deleted

## 2016-10-05 ENCOUNTER — Encounter (HOSPITAL_COMMUNITY): Payer: Self-pay

## 2016-10-05 ENCOUNTER — Ambulatory Visit (HOSPITAL_COMMUNITY)
Admission: RE | Admit: 2016-10-05 | Discharge: 2016-10-05 | Disposition: A | Discharge status: Home / Self Care | Payer: Self-pay | Source: Ambulatory Visit | Attending: Nurse Practitioner | Admitting: Nurse Practitioner

## 2016-10-05 DIAGNOSIS — Z3A13 13 weeks gestation of pregnancy: Secondary | ICD-10-CM | POA: Insufficient documentation

## 2016-10-05 DIAGNOSIS — Z3682 Encounter for antenatal screening for nuchal translucency: Secondary | ICD-10-CM | POA: Insufficient documentation

## 2016-10-05 HISTORY — DX: Other specified health status: Z78.9

## 2017-03-15 LAB — OB RESULTS CONSOLE GBS: GBS: NEGATIVE

## 2017-03-23 ENCOUNTER — Ambulatory Visit: Payer: Self-pay

## 2017-04-06 ENCOUNTER — Encounter (HOSPITAL_COMMUNITY): Payer: Self-pay | Admitting: *Deleted

## 2017-04-06 ENCOUNTER — Inpatient Hospital Stay (HOSPITAL_COMMUNITY)
Admission: AD | Admit: 2017-04-06 | Discharge: 2017-04-06 | Disposition: A | Discharge status: Home / Self Care | Payer: Self-pay | Source: Ambulatory Visit | Attending: Family Medicine | Admitting: Family Medicine

## 2017-04-06 DIAGNOSIS — O471 False labor at or after 37 completed weeks of gestation: Secondary | ICD-10-CM | POA: Insufficient documentation

## 2017-04-06 DIAGNOSIS — O479 False labor, unspecified: Secondary | ICD-10-CM

## 2017-04-06 DIAGNOSIS — O4693 Antepartum hemorrhage, unspecified, third trimester: Secondary | ICD-10-CM | POA: Insufficient documentation

## 2017-04-06 DIAGNOSIS — Z3A39 39 weeks gestation of pregnancy: Secondary | ICD-10-CM | POA: Insufficient documentation

## 2017-04-06 NOTE — MAU Note (Signed)
Urine sent to lab 

## 2017-04-06 NOTE — MAU Provider Note (Signed)
History     CSN: [id number]  Arrival date and time: 04/06/17 1041   First Provider Initiated Contact with Patient 04/06/17 1143      Chief Complaint  Patient presents with  . Vaginal Bleeding   HPI [name] is a 31 y.o. G1P0 at 8751w3d who presents to MAU today with complaint of vaginal bleeding since this morning. The patient states last intercourse was 4 days ago. She denies LOF and is unsure about contractions. She notes abdominal pain with fetal movement only and has noted normal fetal movement today. She states only very light bleeding noted. She denies complications with the pregnancy.   OB History    Gravida Para Term Preterm AB Living   1         0   SAB TAB Ectopic Multiple Live Births                  Past Medical History:  Diagnosis Date  . Medical history non-contributory     Past Surgical History:  Procedure Laterality Date  . NO PAST SURGERIES      History reviewed. No pertinent family history.  Social History  Substance Use Topics  . Smoking status: Never Smoker  . Smokeless tobacco: Never Used  . Alcohol use No    Allergies: No Known Allergies  Prescriptions Prior to Admission  Medication Sig Dispense Refill Last Dose  . Acetaminophen (TYLENOL PO) Take by mouth.   Taking  . diphenhydrAMINE-zinc acetate (BENADRYL ITCH STOPPING) cream Apply topically 3 (three) times daily as needed for itching. (Patient not taking: Reported on 10/05/2016) 28.3 g 0 Not Taking  . Prenatal Vit-Fe Fumarate-FA (PRENATAL VITAMIN PO) Take by mouth.   Taking  . pyridoxine (B-6) 200 MG tablet Take 200 mg by mouth daily.   Not Taking    Review of Systems  Constitutional: Negative for fever.  Gastrointestinal: Positive for abdominal pain. Negative for constipation, diarrhea, nausea and vomiting.  Genitourinary: Positive for vaginal bleeding. Negative for vaginal discharge.   Physical Exam   Blood pressure 108/79, pulse 79, temperature 98.1 F (36.7  C), resp. rate 18, weight 134 lb (60.8 kg), last menstrual period 07/04/2016.  Physical Exam  Nursing note and vitals reviewed. Constitutional: She is oriented to person, place, and time. She appears well-developed and well-nourished. No distress.  HENT:  Head: Normocephalic and atraumatic.  Cardiovascular: Normal rate.   Respiratory: Effort normal.  GI: Soft. She exhibits no distension. There is no tenderness.  Genitourinary: Uterus is enlarged (appropriate for GA). There is bleeding (scant) in the vagina. No vaginal discharge found.  Neurological: She is alert and oriented to person, place, and time.  Skin: Skin is warm and dry. No erythema.  Psychiatric: She has a normal mood and affect.  Dilation: 2 Effacement (%): 50 Cervical Position: Middle Station: -3 Presentation: Vertex Exam by:: Rushie Chestnutj. Shajuan Musso,PA   Fetal Monitoring: Baseline: 135 bpm Variability: moderate Accelerations: 15 x 15 Decelerations: none Contractions: q 6-8 minutes, mild  MAU Course  Procedures None  MDM Scant bleeding on exam.  Cervix unchanged after 1 hour.  Labor precautions discussed with interpreter present  Assessment and Plan  A: SIUP at 2851w3d Vaginal bleeding in pregnancy, third trimester False labor   P:  Discharge home Labor precautions discussed Patient advised to follow-up with [facility] as scheduled for routine prenatal care Patient may return to MAU as needed or if her condition were to change or worsen  Vonzella NippleJulie Berlin Mokry, PA-C 04/06/2017, 12:44 PM

## 2017-04-06 NOTE — MAU Note (Signed)
Back to bathroom.  No care.  C/o vag bleeding, denies pain

## 2017-04-06 NOTE — Discharge Instructions (Signed)
Reposo plvico (Pelvic Rest) CUNDO SE RECOMIENDA EL REPOSO PLVICO? El reposo plvico puede recomendarse en los siguientes casos:  La placenta cubre de forma parcial o total la abertura del cuello del tero (placenta previa).  Hay sangrado entre la pared del tero y el saco amnitico en el primer trimestre de Psychiatristembarazo (hemorragia subcorinica).  El Brownsvilletrabajo de parto comienza muy pronto (trabajo de parto prematuro). Segn la salud general de la madre y el feto, el mdico decidir si el reposo plvico es Fort Recoveryadecuado. CMO HAGO REPOSO PLVICO? Durante el tiempo que le indique el mdico:  No tenga relaciones sexuales, estimulacin sexual ni orgasmos.  No use tampones. No se haga duchas vaginales. No se introduzca nada en la vagina.  No levante ningn objeto que pese ms de 10libras (4,5kg).  Evite las actividades que demanden mucho esfuerzo (extenuantes).  Evite las actividades que requieran esfuerzos de los msculos de la pelvis. CUNDO DEBO BUSCAR ATENCIN MDICA? Solicite atencin mdica de inmediato si:  Tiene clicos en la zona inferior del abdomen.  Tiene secrecin de flujo vaginal.  Tiene un dolor sordo en la parte baja de la espalda.  Tiene contracciones regulares.  Tienen tensin uterina. CUNDO DEBO BUSCAR ASISTENCIA MDICA INMEDIATA? Solicite atencin mdica de inmediato si:  Tiene sangrado vaginal y est embarazada. Esta informacin no tiene Theme park managercomo fin reemplazar el consejo del mdico. Asegrese de hacerle al mdico cualquier pregunta que tenga. Document Released: 05/25/2012 Document Revised: 12/23/2015 Document Reviewed: 03/04/2015 Elsevier Interactive Patient Education  2018 Elsevier Inc.   Hemorragia vaginal durante Firefighterel embarazo (tercer trimestre) (Vaginal Bleeding During Pregnancy, Third Trimester) Durante el embarazo, es comn tener una pequea hemorragia vaginal (manchas). A veces, la hemorragia es normal y no representa un problema, pero en algunas  ocasiones es un sntoma de algo grave. Asegrese de decirle a su mdico de inmediato si tiene algn tipo de hemorragia vaginal. CUIDADOS EN EL HOGAR  Controle su afeccin para ver si hay cambios.  Siga las indicaciones de su mdico con respecto al Middletongrado de actividad que LaCostepuede tener.  Si debe hacer reposo en cama: ? Es posible que deba quedarse en cama y levantarse nicamente para ir al bao. ? Equities traderQuizs le permitan hacer The PNC Financialalgunas actividades. ? Si es necesario, planifique que alguien la ayude.  Marcelino FreestoneEscriba: ? La cantidad de toallas higinicas que Botswanausa cada da. ? La frecuencia con la que se cambia las toallas higinicas. ? Indique que tan empapados (saturados) estn.  No use tampones.  No se haga duchas vaginales.  No tenga relaciones sexuales ni orgasmos hasta que el mdico la autorice.  Siga los consejos de su mdico acerca de levantar objetos pesados, conducir automviles y Education officer, environmentalrealizar actividad fsica.  Si elimina tejido por la vagina, gurdelo para mostrrselo al American Expressmdico.  Tome los medicamentos solamente como se lo haya indicado el mdico.  No tome aspirina, ya que puede causar hemorragias.  Concurra a todas las visitas de control como se lo haya indicado el mdico. SOLICITE AYUDA SI:  Tiene una hemorragia vaginal.  Tiene clicos.  Tiene dolores de [place] Diosparto.  Tiene fiebre que no desaparece despus de Teacher, adult educationtomar medicamentos. SOLICITE AYUDA DE INMEDIATO SI:  Siente clicos muy intensos en la espalda o en el vientre (abdomen).  Tiene escalofros.  Tiene una prdida de lquido por la vagina.  Elimina cogulos grandes o tejido por la vagina.  Tiene ms hemorragia.  Se siente dbil o que va a desvanecerse.  Pierde el conocimiento (se desmaya).  No siente que el beb se mueva  tanto como antes. ASEGRESE DE QUE:  Comprende estas instrucciones.  Controlar su afeccin.  Recibir ayuda de inmediato si no mejora o si empeora. Esta informacin no tiene Theme park manager el  consejo del mdico. Asegrese de hacerle al mdico cualquier pregunta que tenga. Document Released: 01/15/2014 Document Revised: 01/15/2014 Document Reviewed: 05/08/2013 Elsevier Interactive Patient Education  2018 [employer]. Evaluacin de los movimientos fetales  (Fetal Movement Counts) Nombre del paciente: __________________________________________________ Micheline Chapman estimada: ____________________ Caroleen Hamman de los movimientos fetales es muy recomendable en los embarazos de alto riesgo, pero tambin es una buena idea que lo hagan todas las [place]. El Firefighter que comience a contarlos a las 28 semanas de [place]. Los movimientos fetales suelen aumentar:   Despus de Animator.  Despus de la actividad fsica.  Despus de comer o beber [employer] o fro.  En reposo. Preste atencin cuando sienta que el beb est ms activo. Esto le ayudar a notar un patrn de ciclos de vigilia y sueo de su beb y cules son los factores que contribuyen a un aumento de los movimientos fetales. Es importante llevar a cabo un recuento de movimientos fetales, al mismo tiempo cada da, cuando el beb normalmente est ms activo.  CMO CONTAR LOS MOVIMIENTOS FETALES 1. Busque un lugar tranquilo y cmodo para sentarse o recostarse sobre el lado izquierdo. Al recostarse sobre su lado izquierdo, le proporciona una mejor circulacin de [place] y oxgeno al beb. 2. Anote el da y la hora en una hoja de papel o en un diario. 3. Comience contando las pataditas, revoloteos, chasquidos, vueltas o pinchazos en un perodo de 2 horas. Debe sentir al menos 10 movimientos en 2 horas. 4. Si no siente 10 movimientos en 2 horas, espere 2  3 horas y cuente de nuevo. Busque cambios en el patrn o si no cuenta lo suficiente en 2 horas. SOLICITE ATENCIN MDICA SI:   Siente menos de 10 pataditas en 2 horas, en dos intentos.  No hay movimientos durante una hora.  El patrn se modifica o le  lleva ms tiempo Art gallery manager las 10 pataditas.  Siente que el beb no se mueve como lo hace habitualmente. Fecha: ____________ Movimientos: ____________ Stevan Born inicio: ____________ Stevan Born finalizacin: ____________  Franco Nones: ____________ Movimientos: ____________ Stevan Born inicio: ____________ Stevan Born finalizacin: ____________  Franco Nones: ____________ Movimientos: ____________ Stevan Born inicio: ____________ Stevan Born finalizacin: ____________  Franco Nones: ____________ Movimientos: ____________ Stevan Born inicio: ____________ Stevan Born finalizacin: ____________  Franco Nones: ____________ Movimientos: ____________ Stevan Born inicio: ____________ Mammie Russian de finalizacin: ____________  Franco Nones: ____________ Movimientos: ____________ Mammie Russian de inicio: ____________ Mammie Russian de finalizacin: ____________  Franco Nones: ____________ Movimientos: ____________ Mammie Russian de inicio: ____________ Mammie Russian de finalizacin: ____________  Franco Nones: ____________ Movimientos: ____________ Mammie Russian de inicio: ____________ Mammie Russian de finalizacin: ____________  Franco Nones: ____________ Movimientos: ____________ Mammie Russian de inicio: ____________ Mammie Russian de finalizacin: ____________  Franco Nones: ____________ Movimientos: ____________ Mammie Russian de inicio: ____________ Mammie Russian de finalizacin: ____________  Franco Nones: ____________ Movimientos: ____________ Mammie Russian de inicio: ____________ Mammie Russian de finalizacin: ____________  Franco Nones: ____________ Movimientos: ____________ Mammie Russian de inicio: ____________ Mammie Russian de finalizacin: ____________  Franco Nones: ____________ Movimientos: ____________ Mammie Russian de inicio: ____________ Mammie Russian de finalizacin: ____________  Franco Nones: ____________ Movimientos: ____________ Mammie Russian de inicio: ____________ Mammie Russian de finalizacin: ____________  Franco Nones: ____________ Movimientos: ____________ Stevan Born inicio: ____________ Stevan Born finalizacin: ____________  Franco Nones: ____________ Movimientos: ____________ Stevan Born inicio: ____________ Stevan Born finalizacin: ____________  Franco Nones: ____________ Movimientos:  ____________ Stevan Born inicio: ____________ Mammie Russian de finalizacin: ____________  Fecha: ____________ Movimientos: ____________ Stevan Born inicio: ____________ Stevan Born finalizacin: ____________  Franco Nones: ____________ Movimientos: ____________ Stevan Born inicio: ____________ Stevan Born finalizacin: ____________  Franco Nones: ____________ Movimientos: ____________ Stevan Born inicio: ____________ Stevan Born finalizacin: ____________  Franco Nones: ____________ Movimientos: ____________ Stevan Born inicio: ____________ Stevan Born finalizacin: ____________  Franco Nones: ____________ Movimientos: ____________ Stevan Born inicio: ____________ Stevan Born finalizacin: ____________  Franco Nones: ____________ Movimientos: ____________ Mammie Russian de inicio: ____________ Mammie Russian de finalizacin: ____________  Franco Nones: ____________ Movimientos: ____________ Mammie Russian de inicio: ____________ Mammie Russian de finalizacin: ____________  Franco Nones: ____________ Movimientos: ____________ Mammie Russian de inicio: ____________ Mammie Russian de finalizacin: ____________  Franco Nones: ____________ Movimientos: ____________ Mammie Russian de inicio: ____________ Mammie Russian de finalizacin: ____________  Franco Nones: ____________ Movimientos: ____________ Mammie Russian de inicio: ____________ Mammie Russian de finalizacin: ____________  Franco Nones: ____________ Movimientos: ____________ Mammie Russian de inicio: ____________ Mammie Russian de finalizacin: ____________  Franco Nones: ____________ Movimientos: ____________ Mammie Russian de inicio: ____________ Mammie Russian de finalizacin: ____________  Franco Nones: ____________ Movimientos: ____________ Mammie Russian de inicio: ____________ Mammie Russian de finalizacin: ____________  Franco Nones: ____________ Movimientos: ____________ Mammie Russian de inicio: ____________ Mammie Russian de finalizacin: ____________  Franco Nones: ____________ Movimientos: ____________ Mammie Russian de inicio: ____________ Mammie Russian de finalizacin: ____________  Franco Nones: ____________ Movimientos: ____________ Mammie Russian de inicio: ____________ Mammie Russian de finalizacin: ____________  Franco Nones: ____________ Movimientos: ____________ Mammie Russian de inicio: ____________  Mammie Russian de finalizacin: ____________  Franco Nones: ____________ Movimientos: ____________ Mammie Russian de inicio: ____________ Mammie Russian de finalizacin: ____________  Franco Nones: ____________ Movimientos: ____________ Mammie Russian de inicio: ____________ Mammie Russian de finalizacin: ____________  Franco Nones: ____________ Movimientos: ____________ Mammie Russian de inicio: ____________ Mammie Russian de finalizacin: ____________  Franco Nones: ____________ Movimientos: ____________ Mammie Russian de inicio: ____________ Mammie Russian de finalizacin: ____________  Franco Nones: ____________ Movimientos: ____________ Mammie Russian de inicio: ____________ Mammie Russian de finalizacin: ____________  Franco Nones: ____________ Movimientos: ____________ Mammie Russian de inicio: ____________ Mammie Russian de finalizacin: ____________  Franco Nones: ____________ Movimientos: ____________ Mammie Russian de inicio: ____________ Mammie Russian de finalizacin: ____________  Franco Nones: ____________ Movimientos: ____________ Mammie Russian de inicio: ____________ Mammie Russian de finalizacin: ____________  Franco Nones: ____________ Movimientos: ____________ Mammie Russian de inicio: ____________ Mammie Russian de finalizacin: ____________  Franco Nones: ____________ Movimientos: ____________ Mammie Russian de inicio: ____________ Mammie Russian de finalizacin: ____________  Franco Nones: ____________ Movimientos: ____________ Mammie Russian de inicio: ____________ Mammie Russian de finalizacin: ____________  Franco Nones: ____________ Movimientos: ____________ Mammie Russian de inicio: ____________ Mammie Russian de finalizacin: ____________  Franco Nones: ____________ Movimientos: ____________ Mammie Russian de inicio: ____________ Mammie Russian de finalizacin: ____________  Franco Nones: ____________ Movimientos: ____________ Mammie Russian de inicio: ____________ Mammie Russian de finalizacin: ____________  Franco Nones: ____________ Movimientos: ____________ Mammie Russian de inicio: ____________ Mammie Russian de finalizacin: ____________  Franco Nones: ____________ Movimientos: ____________ Mammie Russian de inicio: ____________ Mammie Russian de finalizacin: ____________  Franco Nones: ____________ Movimientos: ____________ Mammie Russian de inicio: ____________ Mammie Russian de finalizacin: ____________  Franco Nones:  ____________ Movimientos: ____________ Mammie Russian de inicio: ____________ Mammie Russian de finalizacin: ____________  Franco Nones: ____________ Movimientos: ____________ Mammie Russian de inicio: ____________ Mammie Russian de finalizacin: ____________  Franco Nones: ____________ Movimientos: ____________ Mammie Russian de inicio: ____________ Mammie Russian de finalizacin: ____________  Franco Nones: ____________ Movimientos: ____________ Mammie Russian de inicio: ____________ Mammie Russian de finalizacin: ____________  Franco Nones: ____________ Movimientos: ____________ Mammie Russian de inicio: ____________ Mammie Russian de finalizacin: ____________  Esta informacin no tiene como fin reemplazar el consejo del mdico. Asegrese de hacerle al mdico cualquier pregunta que tenga. Document Released: 12/08/2007 Document Revised: 08/17/2012 Elsevier Interactive Patient Education  2017 [employer].  [employer] de Designer, multimedia (Braxton Hicks Contractions) Durante el [place], pueden presentarse contracciones uterinas que no siempre indican que est en [place]. QU SON LAS CONTRACCIONES DE BRAXTON HICKS? [place] se presentan antes del [place] de [place] se conocen como contracciones de [place] o falso trabajo de [place]. Hacia el final  del embarazo (32 a 34semanas), estas contracciones pueden aparecen con ms frecuencia y volverse ms intensas. No corresponden al Aleen Campi de parto verdadero porque estas contracciones no producen el agrandamiento (la dilatacin) y el afinamiento del cuello del tero. Algunas veces, es difcil distinguirlas del trabajo de parto verdadero porque en algunos casos pueden ser [employer], y las personas tienen diferentes niveles de tolerancia al [employer]. No debe sentirse avergonzada si concurre al hospital con falso trabajo de [place]. En ocasiones, la nica forma de saber si el trabajo de parto es verdadero es que el mdico determine si hay cambios en el cuello del tero. Si no hay problemas prenatales u otras complicaciones de salud asociadas con el embarazo,  no habr inconvenientes si la envan a su casa con falso trabajo de parto y espera que comience el verdadero. CMO DIFERENCIAR EL TRABAJO DE PARTO FALSO DEL VERDADERO Falso trabajo de parto  Las contracciones del falso trabajo de parto duran menos y no son tan intensas como las verdaderas.  Generalmente son irregulares.  A menudo, se sienten en la parte delantera de la parte baja del abdomen y en la ingle,  y pueden desaparecer cuando camina o cambia de posicin mientras est acostada.  Las contracciones se vuelven ms dbiles y su duracin es Adult nurse a medida que el tiempo transcurre.  Por lo general, no se hacen progresivamente ms intensas, regulares y Herbalist entre s como en el caso del [place] de parto verdadero. Theodis Blaze de parto  Las contracciones del verdadero trabajo de parto duran de 30 a 70segundos, son muy regulares y suelen volverse ms intensas, y [place] su frecuencia.  No desaparecen cuando camina.  La molestia generalmente se siente en la parte superior del tero y se extiende hacia la zona inferior del abdomen y [employer] cintura.  El mdico podr examinarla para determinar si el trabajo de parto es verdadero. El examen mostrar si el cuello del tero se est dilatando y [place]. LO QUE DEBE RECORDAR  [place] haciendo los ejercicios habituales y siga otras indicaciones que el mdico le d.  Tome todos los medicamentos como le indic el mdico.  Oceanographer a las visitas prenatales regulares.  Coma y beba con moderacin si cree que est en trabajo de parto.  Si las contracciones de [employer] provocan incomodidad: ? Cambie de posicin: si est acostada o descansando, camine; si est caminando, descanse. ? Sintese y descanse en una baera con agua tibia. ? Beba 2 o 3vasos de agua. La deshidratacin puede provocar contracciones. ? Respire lenta y profundamente varias veces por hora.  CUNDO DEBO BUSCAR ASISTENCIA MDICA INMEDIATA? Solicite  atencin mdica de inmediato si:  Las contracciones se intensifican, se hacen ms regulares y Arboriculturist s.  Tiene una prdida de lquido por la vagina.  Tiene fiebre.  Elimina mucosidad manchada con [place].  Tiene una hemorragia vaginal abundante.  Tiene dolor abdominal permanente.  Tiene un dolor en la zona lumbar que nunca tuvo antes.  Siente que la cabeza del beb empuja hacia abajo y ejerce presin en la zona plvica.  El beb no se mueve Dentist. Esta informacin no tiene Theme park manager el consejo del mdico. Asegrese de hacerle al mdico cualquier pregunta que tenga. Document Released: 06/10/2005 Document Revised: 12/23/2015 Document Reviewed: 06/12/2013 Elsevier Interactive Patient Education  2017 [employer].

## 2017-04-06 NOTE — MAU Note (Signed)
Pt presents to MAU with complaints of vaginal bleeding when she wipes. Intercourse 4 days ago. Denies any abnormal discharge or LOF. Reports baby is active

## 2017-04-07 ENCOUNTER — Encounter (HOSPITAL_COMMUNITY): Payer: Self-pay | Admitting: *Deleted

## 2017-04-07 ENCOUNTER — Inpatient Hospital Stay (HOSPITAL_COMMUNITY)
Admission: AD | Admit: 2017-04-07 | Discharge: 2017-04-07 | Disposition: A | Discharge status: Home / Self Care | Payer: Self-pay | Source: Ambulatory Visit | Attending: Obstetrics & Gynecology | Admitting: Obstetrics & Gynecology

## 2017-04-07 DIAGNOSIS — O479 False labor, unspecified: Secondary | ICD-10-CM

## 2017-04-07 DIAGNOSIS — Z3A39 39 weeks gestation of pregnancy: Secondary | ICD-10-CM | POA: Insufficient documentation

## 2017-04-07 DIAGNOSIS — O471 False labor at or after 37 completed weeks of gestation: Secondary | ICD-10-CM | POA: Insufficient documentation

## 2017-04-07 NOTE — Discharge Instructions (Signed)
Contracciones de Braxton Hicks °(Braxton Hicks Contractions) °Durante el embarazo, pueden presentarse contracciones uterinas que no siempre indican que está en trabajo de parto. °¿QUÉ SON LAS CONTRACCIONES DE BRAXTON HICKS? °Las contracciones que se presentan antes del trabajo de parto se conocen como contracciones de Braxton Hicks o falso trabajo de parto. Hacia el final del embarazo (32 a 34 semanas), estas contracciones pueden aparecen con más frecuencia y volverse más intensas. No corresponden al trabajo de parto verdadero porque estas contracciones no producen el agrandamiento (la dilatación) y el afinamiento del cuello del útero. Algunas veces, es difícil distinguirlas del trabajo de parto verdadero porque en algunos casos pueden ser muy intensas, y las personas tienen diferentes niveles de tolerancia al dolor. No debe sentirse avergonzada si concurre al hospital con falso trabajo de parto. En ocasiones, la única forma de saber si el trabajo de parto es verdadero es que el médico determine si hay cambios en el cuello del útero. °Si no hay problemas prenatales u otras complicaciones de salud asociadas con el embarazo, no habrá inconvenientes si la envían a su casa con falso trabajo de parto y espera que comience el verdadero. °CÓMO DIFERENCIAR EL TRABAJO DE PARTO FALSO DEL VERDADERO °Falso trabajo de parto  °· Las contracciones del falso trabajo de parto duran menos y no son tan intensas como las verdaderas. °· Generalmente son irregulares. °· A menudo, se sienten en la parte delantera de la parte baja del abdomen y en la ingle, °· y pueden desaparecer cuando camina o cambia de posición mientras está acostada. °· Las contracciones se vuelven más débiles y su duración es menor a medida que el tiempo transcurre. °· Por lo general, no se hacen progresivamente más intensas, regulares y cercanas entre sí como en el caso del trabajo de parto verdadero. °Verdadero trabajo de parto  °· Las contracciones del verdadero  trabajo de parto duran de 30 a 70 segundos, son muy regulares y suelen volverse más intensas, y aumenta su frecuencia. °· No desaparecen cuando camina. °· La molestia generalmente se siente en la parte superior del útero y se extiende hacia la zona inferior del abdomen y hacia la cintura. °· El médico podrá examinarla para determinar si el trabajo de parto es verdadero. El examen mostrará si el cuello del útero se está dilatando y afinando. °LO QUE DEBE RECORDAR °· Continúe haciendo los ejercicios habituales y siga otras indicaciones que el médico le dé. °· Tome todos los medicamentos como le indicó el médico. °· Concurra a las visitas prenatales regulares. °· Coma y beba con moderación si cree que está en trabajo de parto. °· Si las contracciones de Braxton Hicks le provocan incomodidad: °¨ Cambie de posición: si está acostada o descansando, camine; si está caminando, descanse. °¨ Siéntese y descanse en una bañera con agua tibia. °¨ Beba 2 o 3 vasos de agua. La deshidratación puede provocar contracciones. °¨ Respire lenta y profundamente varias veces por hora. °¿CUÁNDO DEBO BUSCAR ASISTENCIA MÉDICA INMEDIATA? °Solicite atención médica de inmediato si: °· Las contracciones se intensifican, se hacen más regulares y cercanas entre sí. °· Tiene una pérdida de líquido por la vagina. °· Tiene fiebre. °· Elimina mucosidad manchada con sangre. °· Tiene una hemorragia vaginal abundante. °· Tiene dolor abdominal permanente. °· Tiene un dolor en la zona lumbar que nunca tuvo antes. °· Siente que la cabeza del bebé empuja hacia abajo y ejerce presión en la zona pélvica. °· El bebé no se mueve tanto como solía. °Esta información no tiene como fin reemplazar el   consejo del médico. Asegúrese de hacerle al médico cualquier pregunta que tenga. °Document Released: 06/10/2005 Document Revised: 12/23/2015 Document Reviewed: 06/12/2013 °Elsevier Interactive Patient Education © 2017 Elsevier Inc. ° °

## 2017-04-07 NOTE — MAU Note (Signed)
I have communicated with Dr. Doroteo GlassmanPhelps and reviewed vital signs:  Vitals:   04/07/17 1403 04/07/17 1622  BP: 119/77 111/80  Pulse: 80 94  Resp: 18   Temp: 98.1 F (36.7 C)     Vaginal exam:  Dilation: 3.5 Effacement (%): 80 Cervical Position: [place]: -2 Presentation: Vertex Exam by:: Dorrene GermanJ. Masato Pettie RN,   Also reviewed contraction pattern and that non-stress test is reactive.  It has been documented that patient is contracting irregularly with minimal cervical change over 3 hours not indicating active labor.  Patient denies any other complaints.  Based on this report provider has given order for discharge.  A discharge order and diagnosis entered by a provider.   Labor discharge instructions reviewed with patient.

## 2017-04-07 NOTE — MAU Note (Signed)
Urine in lab 

## 2017-04-08 ENCOUNTER — Inpatient Hospital Stay (HOSPITAL_COMMUNITY): Payer: Medicaid Other | Admitting: Anesthesiology

## 2017-04-08 ENCOUNTER — Inpatient Hospital Stay (HOSPITAL_COMMUNITY)
Admission: AD | Admit: 2017-04-08 | Discharge: 2017-04-10 | DRG: 775 | Disposition: A | Discharge status: Home / Self Care | Payer: Medicaid Other | Source: Ambulatory Visit | Attending: Obstetrics and Gynecology | Admitting: Obstetrics and Gynecology

## 2017-04-08 ENCOUNTER — Encounter (HOSPITAL_COMMUNITY): Payer: Self-pay

## 2017-04-08 DIAGNOSIS — Z3493 Encounter for supervision of normal pregnancy, unspecified, third trimester: Secondary | ICD-10-CM | POA: Diagnosis present

## 2017-04-08 DIAGNOSIS — Z3A39 39 weeks gestation of pregnancy: Secondary | ICD-10-CM

## 2017-04-08 DIAGNOSIS — O3663X0 Maternal care for excessive fetal growth, third trimester, not applicable or unspecified: Principal | ICD-10-CM | POA: Diagnosis present

## 2017-04-08 LAB — CBC
HCT: 38.6 % (ref 36.0–46.0)
Hemoglobin: 13.1 g/dL (ref 12.0–15.0)
MCH: 32.3 pg (ref 26.0–34.0)
MCHC: 33.9 g/dL (ref 30.0–36.0)
MCV: 95.3 fL (ref 78.0–100.0)
PLATELETS: 270 10*3/uL (ref 150–400)
RBC: 4.05 MIL/uL (ref 3.87–5.11)
RDW: 14.4 % (ref 11.5–15.5)
WBC: 12.7 10*3/uL — AB (ref 4.0–10.5)

## 2017-04-08 LAB — TYPE AND SCREEN
ABO/RH(D): O POS
ANTIBODY SCREEN: NEGATIVE

## 2017-04-08 LAB — RPR: RPR: NONREACTIVE

## 2017-04-08 LAB — ABO/RH: ABO/RH(D): O POS

## 2017-04-08 MED ORDER — FENTANYL CITRATE (PF) 100 MCG/2ML IJ SOLN
INTRAMUSCULAR | Status: AC
  Administered 2017-04-08: 100 ug via INTRAVENOUS
  Filled 2017-04-08: qty 2

## 2017-04-08 MED ORDER — SOD CITRATE-CITRIC ACID 500-334 MG/5ML PO SOLN: 30.0000 mL | ORAL | Status: DC | PRN

## 2017-04-08 MED ORDER — PHENYLEPHRINE 40 MCG/ML (10ML) SYRINGE FOR IV PUSH (FOR BLOOD PRESSURE SUPPORT)
80.0000 ug | PREFILLED_SYRINGE | INTRAVENOUS | Status: DC | PRN
  Filled 2017-04-08: qty 5

## 2017-04-08 MED ORDER — LACTATED RINGERS IV SOLN
INTRAVENOUS | Status: DC
  Administered 2017-04-08: 04:00:00 via INTRAVENOUS

## 2017-04-08 MED ORDER — IBUPROFEN 600 MG PO TABS
600.0000 mg | ORAL_TABLET | Freq: Four times a day (QID) | ORAL | Status: DC
  Administered 2017-04-08 – 2017-04-10 (×6): 600 mg via ORAL
  Filled 2017-04-08 (×6): qty 1

## 2017-04-08 MED ORDER — ZOLPIDEM TARTRATE 5 MG PO TABS: 5.0000 mg | ORAL_TABLET | Freq: Every evening | ORAL | Status: DC | PRN

## 2017-04-08 MED ORDER — SIMETHICONE 80 MG PO CHEW: 80.0000 mg | CHEWABLE_TABLET | ORAL | Status: DC | PRN

## 2017-04-08 MED ORDER — DIBUCAINE 1 % RE OINT: 1.0000 "application " | TOPICAL_OINTMENT | RECTAL | Status: DC | PRN

## 2017-04-08 MED ORDER — ACETAMINOPHEN 325 MG PO TABS: 650.0000 mg | ORAL_TABLET | ORAL | Status: DC | PRN

## 2017-04-08 MED ORDER — BENZOCAINE-MENTHOL 20-0.5 % EX AERO
1.0000 "application " | INHALATION_SPRAY | CUTANEOUS | Status: DC | PRN
  Administered 2017-04-10: 1 via TOPICAL
  Filled 2017-04-08 (×2): qty 56

## 2017-04-08 MED ORDER — OXYTOCIN 40 UNITS IN LACTATED RINGERS INFUSION - SIMPLE MED
2.5000 [IU]/h | INTRAVENOUS | Status: DC
  Filled 2017-04-08: qty 1000

## 2017-04-08 MED ORDER — FENTANYL CITRATE (PF) 100 MCG/2ML IJ SOLN
50.0000 ug | INTRAMUSCULAR | Status: DC | PRN
  Administered 2017-04-08: 100 ug via INTRAVENOUS

## 2017-04-08 MED ORDER — COCONUT OIL OIL
1.0000 "application " | TOPICAL_OIL | Status: DC | PRN
  Filled 2017-04-08: qty 120

## 2017-04-08 MED ORDER — PHENYLEPHRINE 40 MCG/ML (10ML) SYRINGE FOR IV PUSH (FOR BLOOD PRESSURE SUPPORT)
PREFILLED_SYRINGE | INTRAVENOUS | Status: AC
  Filled 2017-04-08: qty 10

## 2017-04-08 MED ORDER — ONDANSETRON HCL 4 MG/2ML IJ SOLN: 4.0000 mg | INTRAMUSCULAR | Status: DC | PRN

## 2017-04-08 MED ORDER — SENNOSIDES-DOCUSATE SODIUM 8.6-50 MG PO TABS
2.0000 | ORAL_TABLET | ORAL | Status: DC
  Administered 2017-04-08 – 2017-04-09 (×2): 2 via ORAL
  Filled 2017-04-08 (×2): qty 2

## 2017-04-08 MED ORDER — DIPHENHYDRAMINE HCL 25 MG PO CAPS: 25.0000 mg | ORAL_CAPSULE | Freq: Four times a day (QID) | ORAL | Status: DC | PRN

## 2017-04-08 MED ORDER — DIPHENHYDRAMINE HCL 50 MG/ML IJ SOLN
12.5000 mg | INTRAMUSCULAR | Status: DC | PRN
Start: 2017-04-08 — End: 2017-04-08

## 2017-04-08 MED ORDER — OXYCODONE-ACETAMINOPHEN 5-325 MG PO TABS: 2.0000 | ORAL_TABLET | ORAL | Status: DC | PRN

## 2017-04-08 MED ORDER — ONDANSETRON HCL 4 MG/2ML IJ SOLN: 4.0000 mg | Freq: Four times a day (QID) | INTRAMUSCULAR | Status: DC | PRN

## 2017-04-08 MED ORDER — PRENATAL MULTIVITAMIN CH
1.0000 | ORAL_TABLET | Freq: Every day | ORAL | Status: DC
  Administered 2017-04-09: 1 via ORAL
  Filled 2017-04-08: qty 1

## 2017-04-08 MED ORDER — OXYCODONE-ACETAMINOPHEN 5-325 MG PO TABS: 1.0000 | ORAL_TABLET | ORAL | Status: DC | PRN

## 2017-04-08 MED ORDER — LIDOCAINE HCL (PF) 1 % IJ SOLN
30.0000 mL | INTRAMUSCULAR | Status: DC | PRN
  Administered 2017-04-08: 30 mL via SUBCUTANEOUS
  Filled 2017-04-08: qty 30

## 2017-04-08 MED ORDER — TETANUS-DIPHTH-ACELL PERTUSSIS 5-2.5-18.5 LF-MCG/0.5 IM SUSP: 0.5000 mL | Freq: Once | INTRAMUSCULAR | Status: DC

## 2017-04-08 MED ORDER — LACTATED RINGERS IV SOLN: 500.0000 mL | Freq: Once | INTRAVENOUS | Status: DC

## 2017-04-08 MED ORDER — FENTANYL 2.5 MCG/ML BUPIVACAINE 1/10 % EPIDURAL INFUSION (WH - ANES)
14.0000 mL/h | INTRAMUSCULAR | Status: DC | PRN
  Administered 2017-04-08 (×2): 14 mL/h via EPIDURAL
  Filled 2017-04-08: qty 100

## 2017-04-08 MED ORDER — TERBUTALINE SULFATE 1 MG/ML IJ SOLN
0.2500 mg | Freq: Once | INTRAMUSCULAR | Status: DC | PRN
  Filled 2017-04-08: qty 1

## 2017-04-08 MED ORDER — OXYTOCIN 40 UNITS IN LACTATED RINGERS INFUSION - SIMPLE MED
1.0000 m[IU]/min | INTRAVENOUS | Status: DC
  Administered 2017-04-08: 2 m[IU]/min via INTRAVENOUS

## 2017-04-08 MED ORDER — ONDANSETRON HCL 4 MG PO TABS: 4.0000 mg | ORAL_TABLET | ORAL | Status: DC | PRN

## 2017-04-08 MED ORDER — WITCH HAZEL-GLYCERIN EX PADS: 1.0000 "application " | MEDICATED_PAD | CUTANEOUS | Status: DC | PRN

## 2017-04-08 MED ORDER — FENTANYL 2.5 MCG/ML BUPIVACAINE 1/10 % EPIDURAL INFUSION (WH - ANES)
INTRAMUSCULAR | Status: AC
  Filled 2017-04-08: qty 100

## 2017-04-08 MED ORDER — LACTATED RINGERS IV SOLN: 500.0000 mL | INTRAVENOUS | Status: DC | PRN

## 2017-04-08 MED ORDER — OXYTOCIN BOLUS FROM INFUSION
500.0000 mL | Freq: Once | INTRAVENOUS | Status: AC
  Administered 2017-04-08: 500 mL/h via INTRAVENOUS

## 2017-04-08 MED ORDER — EPHEDRINE 5 MG/ML INJ
10.0000 mg | INTRAVENOUS | Status: DC | PRN
  Filled 2017-04-08: qty 2

## 2017-04-08 MED ORDER — LIDOCAINE HCL (PF) 1 % IJ SOLN
INTRAMUSCULAR | Status: DC | PRN
  Administered 2017-04-08 (×2): 5 mL via EPIDURAL

## 2017-04-08 NOTE — H&P (Signed)
LABOR AND DELIVERY ADMISSION HISTORY AND PHYSICAL NOTE  Nichole Hughes is a 31 y.o. female G1P0 with IUP at 5463w5d by LMP/13wk sono presenting for SOL. [facility]  She reports positive fetal movement. She denies leakage of fluid or vaginal bleeding. Some bloody show.  Patient denies headache, vision changes, chest pain, shortness of breath, or RUQ pain.   Prenatal History/Complications:  Past Medical History: Past Medical History:  Diagnosis Date  . Medical history non-contributory     Past Surgical History: Past Surgical History:  Procedure Laterality Date  . NO PAST SURGERIES      Obstetrical History: OB History    Gravida Para Term Preterm AB Living   1         0   SAB TAB Ectopic Multiple Live Births                  Social History: Social History   Social History  . Marital status: Single    Spouse name: N/A  . Number of children: 0  . Years of education: N/A   Occup[place]   Social History Main Topics  . Smoking status: Never Smoker  . Smokeless tobacco: Never Used  . Alcohol use No  . Drug use: No  . Sexual activity: Not Asked   Other Topics Concern  . None   Social History Narrative  . None    Family History: Family History  Problem Relation Age of Onset  . Diabetes Mother     Allergies: No Known Allergies  Prescriptions Prior to Admission  Medication Sig Dispense Refill Last Dose  . Prenatal Vit-Fe Fumarate-FA (PRENATAL VITAMIN PO) Take by mouth.   04/07/2017 at Unknown time     Review of Systems   All systems reviewed and negative except as stated in HPI  Height 5\' 2"  (1.575 m), weight 60.8 kg (134 lb), last menstrual period 07/04/2016. General appearance: alert, cooperative, appears stated age and no distress Lungs: clear to auscultation bilaterally Heart: regular rate and rhythm Abdomen: soft, non-tender; bowel sounds  normal Extremities: No calf swelling or tenderness Presentation: cephalic Fetal monitoring: 130bpm/mod/-ac/no decelerations, mother's HR Uterine activity: every 4 minutes Dilation: 4.5 Effacement (%): 80 Station: -2, -1 Exam by:: A Cioce RN    Prenatal labs: ABO, Rh: --/--/O POS (07/26 0254) Antibody: PENDING (07/26 0254) Rubella: Immune RPR: Nonreactive (01/11 0000)  HBsAg: Negative (01/11 0000)  HIV: Non-reactive (01/11 0000)  GBS: Negative (07/02 0000)  1 hr Glucola: 160->3hr GTT wnl Genetic screening: Normal Anatomy US: Normal except 94% EGW  Prenatal Transfer Tool  Maternal Diabetes: No, abnormal 1hr GTT, normal 3hr Genetic Screening: Normal Maternal Ultrasounds/Referrals: Abnormal:  Findings:   Other:LGA(94%) @ 25wk scan Fetal Ultrasounds or other Referrals:  None Maternal Substance Abuse:  No Significant Maternal Medications:  None Significant Maternal Lab Results: Lab values include: Group B Strep negative  Results for orders placed or performed during the hospital encounter of 04/08/17 (from the past 24 hour(s))  CBC   Collection Time: 04/08/17  2:54 AM  Result Value Ref Range   WBC 12.7 (H) 4.0 - 10.5 K/uL   RBC 4.05 3.87 - 5.11 MIL/uL   Hemoglobin 13.1 12.0 - 15.0 g/dL   HCT 16.138.6 09.636.0 - 04.546.0 %   MCV 95.3 78.0 - 100.0 fL   MCH 32.3 26.0 - 34.0 pg   MCHC 33.9 30.0 - 36.0 g/dL  RDW 14.4 11.5 - 15.5 %   Platelets 270 150 - 400 K/uL  Type and screen   Collection Time: 04/08/17  2:54 AM  Result Value Ref Range   ABO/RH(D) O POS    Antibody Screen PENDING    Sample Expiration 04/11/2017     Patient Active Problem List   Diagnosis Date Noted  . Normal labor 04/08/2017  . LGSIL (low grade squamous intraepithelial dysplasia) 10/17/2014  . Discoloration of skin 06/26/2013  . Preventative health care 06/26/2013    Assessment: Nichole Hughes is a 31 y.o. G1P0 at 7836w5d here for SOL  #Labor: Expectant management, pitocin as needed #Pain: IV  pain meds, prefer no epidural, will reassess when advanced #FWB: Cat 1 #GBS: Negative (07/02 0000) #MOF: Breast #MOC: Pills #Circ: No Conard NovakJoshua A Christian 04/08/2017, 4:32 AM  CNM attestation:  I have seen and examined this patient; I agree with above documentation in the resident's note.   Nichole Hughes is a 31 y.o. G1P0 here for latent labor  PE: BP 111/79   Pulse 68   Resp 18   Ht 5\' 2"  (1.575 m)   Wt 60.8 kg (134 lb)   LMP 07/04/2016   SpO2 99%   BMI 24.51 kg/m   Resp: normal effort, no distress Abd: gravid  ROS, labs, PMH reviewed  Plan: Admit to [facility] Growth 94% at 25wk scan Expectant management w/ augmentation prn Plans epidural Anticipate SVD  Cam HaiSHAW, La Dibella CNM 04/08/2017, 7:45 AM

## 2017-04-08 NOTE — Anesthesia Pain Management Evaluation Note (Signed)
  CRNA Pain Management Visit Note  Patient: Nichole Hughes, 31 y.o., female  "Hello I am a member of the anesthesia team at [facility]. We have an anesthesia team available at all times to provide care throughout the hospital, including epidural management and anesthesia for C-section. I don't know your plan for the delivery whether it a natural birth, water birth, IV sedation, nitrous supplementation, doula or epidural, but we want to meet your pain goals."   1.Was your pain managed to your expectations on prior hospitalizations?   No prior hospitalizations  2.What is your expectation for pain management during this hospitalization?     Epidural  3.How can we help you reach that goal? Epidural intact and working well per interpreter  Record the patient's initial score and the patient's pain goal.   Pain: 0  Pain Goal: 8 [facility] wants you to be able to say your pain was always managed very well.  Edison PaceWILKERSON,Alawna Graybeal 04/08/2017

## 2017-04-08 NOTE — Anesthesia Procedure Notes (Signed)
Epidural Patient location during procedure: OB Start time: 04/08/2017 4:37 AM End time: 04/08/2017 4:53 AM  Staffing Anesthesiologist: Heather RobertsSINGER, Jenipher Havel Performed: anesthesiologist   Preanesthetic Checklist Completed: patient identified, site marked, pre-op evaluation, timeout performed, IV checked, risks and benefits discussed and monitors and equipment checked  Epidural Patient position: sitting Prep: DuraPrep Patient monitoring: heart rate, cardiac monitor, continuous pulse ox and blood pressure Approach: midline Location: L2-L3 Injection technique: LOR saline  Needle:  Needle type: Tuohy  Needle gauge: 17 G Needle length: 9 cm Needle insertion depth: 6 cm Catheter size: 20 Guage Catheter at skin depth: 11 cm Test dose: negative and Other  Assessment Events: blood not aspirated, injection not painful, no injection resistance and negative IV test  Additional Notes Informed consent obtained prior to proceeding including risk of failure, 1% risk of PDPH, risk of minor discomfort and bruising.  Discussed rare but serious complications including epidural abscess, permanent nerve injury, epidural hematoma.  Discussed alternatives to epidural analgesia and patient desires to proceed.  Timeout performed pre-procedure verifying patient name, procedure, and platelet count.  Patient tolerated procedure well.

## 2017-04-08 NOTE — MAU Note (Signed)
Pt here with c/o contractions that have worsened today and now are closer. Denies any leaking of fluid; having some bleeding.

## 2017-04-08 NOTE — Progress Notes (Signed)
I was present during Delivery with CNM Melanie, RN Lujean RaveLauren McDaniel, Also I assisted RN with the admission questions to the MBW, ordered pt meals too, by Orlan LeavensViria Alvarez Spanish Interpreter.

## 2017-04-08 NOTE — Progress Notes (Signed)
Labor Progress Note Nichole Hughes is a 31 y.o. G1P0 at 6365w5d presented for labor  S:  Comfortable with epidural, feeling some cramping.  O:  BP 127/87   Pulse 64   Temp 97.9 F (36.6 C) (Oral)   Resp 18   Ht 5\' 2"  (1.575 m)   Wt 134 lb (60.8 kg)   LMP 07/04/2016   SpO2 99%   BMI 24.51 kg/m  EFM: baseline 120 bpm/ mod variability/ + accels/ no decels  Toco: 2-3 SVE: 8/90/0 Pitocin: 6 mu/min  A/P: 31 y.o. G1P0 4365w5d  1. Labor: arrested 2. FWB: Cat I 3. Pain: epidural IUPC placed, anterior cervical lip edematous >? Asynclitic. Continue Pitocin titration for adequate labor. Use peanut ball. Guarded for SVD. Recheck in 2-3 hrs.    Donette LarryMelanie Diara Chaudhari, CNM 2:57 PM

## 2017-04-08 NOTE — Progress Notes (Signed)
Delivery of live viable female by Dr Talbert ForestShirley, assisted by Judie PetitM. Denyse AmassBhambri, CNM APGARs [phone]

## 2017-04-08 NOTE — Anesthesia Preprocedure Evaluation (Signed)
Anesthesia Evaluation  Patient identified by MRN, date of birth, ID band Patient awake    Reviewed: Allergy & Precautions, NPO status , Patient's Chart, lab work & pertinent test results  History of Anesthesia Complications Negative for: history of anesthetic complications  Airway Mallampati: II  TM Distance: >3 FB Neck ROM: Full    Dental no notable dental hx. (+) Dental Advisory Given   Pulmonary neg pulmonary ROS,    Pulmonary exam normal        Cardiovascular negative cardio ROS Normal cardiovascular exam     Neuro/Psych negative neurological ROS  negative psych ROS   GI/Hepatic negative GI ROS, Neg liver ROS,   Endo/Other  negative endocrine ROS  Renal/GU negative Renal ROS  negative genitourinary   Musculoskeletal negative musculoskeletal ROS (+)   Abdominal   Peds negative pediatric ROS (+)  Hematology negative hematology ROS (+)   Anesthesia Other Findings   Reproductive/Obstetrics (+) Pregnancy                             Anesthesia Physical Anesthesia Plan  ASA: II  Anesthesia Plan: Epidural   Post-op Pain Management:    Induction:   PONV Risk Score and Plan:   Airway Management Planned: Natural Airway  Additional Equipment:   Intra-op Plan:   Post-operative Plan:   Informed Consent: I have reviewed the patients History and Physical, chart, labs and discussed the procedure including the risks, benefits and alternatives for the proposed anesthesia with the patient or authorized representative who has indicated his/her understanding and acceptance.     Dental advisory given  Plan Discussed with: Anesthesiologist  Anesthesia Plan Comments:         Anesthesia Quick Evaluation  

## 2017-04-08 NOTE — Progress Notes (Signed)
Patient ID: Nichole Hughes, female   DOB: 10/17/1985, 31 y.o.   MRN: [id number] Labor Progress Note  S: Patient examined for progress of labor. Patient comfortable with epidural.   O: BP 127/87   Pulse 64   Temp 97.9 F (36.6 C) (Oral)   Resp 18   Ht 5\' 2"  (1.575 m)   Wt 60.8 kg (134 lb)   LMP 07/04/2016   SpO2 99%   BMI 24.51 kg/m   FHT: 120bpm, mod var, +accels, no decels TOCO: q2-284min, patient looks comfortable during contractions  CVE: Dilation: 8 Effacement (%): 100 Cervical Position: Anterior Station: 0 Presentation: Vertex Exam by:: Lujean RaveLauren McDaniel RN  A&P: 31 y.o. G1P0 1432w5d SOL   Currently on 6 milli-units of pitocin for augmentation, started at 1130 Continue current management Anticipate SVD  [place] Dalinda Heidt, DO FM Resident PGY-1 04/08/2017 1:55 PM

## 2017-04-08 NOTE — Progress Notes (Signed)
LABOR PROGRESS NOTE  Nichole Hughes is a 31 y.o. G1P0 at 7057w5d  admitted for SOL  Subjective: Patient comfortable with epidural. Patient denies headache, vision changes, chest pain, shortness of breath, or RUQ pain.   Objective: BP 111/70   Pulse 69   Resp 18   Ht 5\' 2"  (1.575 m)   Wt 60.8 kg (134 lb)   LMP 07/04/2016   SpO2 99%   BMI 24.51 kg/m  or  Vitals:   04/08/17 0516 04/08/17 0517 04/08/17 0521 04/08/17 0522  BP:  113/77  111/70  Pulse:  95  69  Resp:      SpO2: 100%  99%   Weight:      Height:        FHT: 130bpm/mod/-ac/-dc Toco: q2-4 minutes Dilation: 6 Effacement (%): 80 Cervical Position: Anterior Station: -2 Presentation: Vertex Exam by:: Barrie DunkerMurayyah Johnson RN  Labs: Lab Results  Component Value Date   WBC 12.7 (H) 04/08/2017   HGB 13.1 04/08/2017   HCT 38.6 04/08/2017   MCV 95.3 04/08/2017   PLT 270 04/08/2017    Patient Active Problem List   Diagnosis Date Noted  . Normal labor 04/08/2017  . LGSIL (low grade squamous intraepithelial dysplasia) 10/17/2014  . Discoloration of skin 06/26/2013  . Preventative health care 06/26/2013    Assessment / Plan: 31 y.o. G1P0 at 6657w5d here for SOL  Labor: Expectant management, cervix 4.5->6 past 2hr, will consider pitocin if no change by late morning, membranes intact Fetal Wellbeing:  Cat 1 Pain Control:  Epidural in place Anticipated MOD:  Vaginal  Conard NovakJoshua A Makensey Rego, MD 04/08/2017, 5:59 AM

## 2017-04-08 NOTE — MAU Note (Signed)
Patient refused interpreter services, would prefer her husband to interpret.  Form signed and placed in chart.

## 2017-04-08 NOTE — Progress Notes (Signed)
Patient ID: Nichole Hughes, female   DOB: 03/20/1986, 31 y.o.   MRN: [id number] Labor Progress Note  S: Patient seen & examined for progress of labor. Patient comfortable with epidural.   O: BP 111/74   Pulse 64   Temp 97.7 F (36.5 C) (Oral)   Resp 18   Ht 5\' 2"  (1.575 m)   Wt 60.8 kg (134 lb)   LMP 07/04/2016   SpO2 99%   BMI 24.51 kg/m   FHT: 120bpm, mod var, +accels, no decels TOCO: q2-983min, patient looks comfortable during contractions  CVE: Dilation: 7 Effacement (%): 80 Cervical Position: Anterior Station: -2 Presentation: Vertex Exam by:: Caren HazyLauren McDaniel rN  A&P: 31 y.o. G1P0 2235w5d for SOL  AROM @ 0930 with small amount of clear fluid Continue expectant management Anticipate SVD  [place] Yasamin Karel, DO FM Resident PGY-1 04/08/2017 9:51 AM

## 2017-04-09 NOTE — Anesthesia Postprocedure Evaluation (Signed)
Anesthesia Post Note  Patient: Kathryne GinMaria Luvia Benway  Procedure(s) Performed: * No procedures listed *     Patient location during evaluation: Mother Baby Anesthesia Type: Epidural Level of consciousness: awake and alert and oriented Pain management: pain level controlled Vital Signs Assessment: post-procedure vital signs reviewed and stable Respiratory status: spontaneous breathing and nonlabored ventilation Cardiovascular status: stable Postop Assessment: no headache, no signs of nausea or vomiting, no backache, adequate PO intake, epidural receding and patient able to bend at knees Anesthetic complications: no    Last Vitals:  Vitals:   04/08/17 2152 04/09/17 0235  BP: 114/75 (!) 96/51  Pulse: 69 63  Resp: 18 16  Temp: 36.9 C 36.9 C    Last Pain:  Vitals:   04/09/17 0235  TempSrc: Oral  PainSc: 0-No pain   Pain Goal:                 Land O'LakesMalinova,Teia Freitas Hristova

## 2017-04-09 NOTE — Lactation Note (Signed)
This note was copied from a baby's chart. Lactation Consultation Note  Patient Name: Nichole Hughes JXBJY'NToday's Date: 04/09/2017 Reason for consult: Initial assessment    With this first time mom and term baby, now 20 hours ld. Mom states the baby has been on and off the breast, and very sleepy. The baby was dressed in hat, socks and warm stretchy, and blankets, and felt very warm. Mom allowed me to undress the baby, and place him skin to skin. Mom reports some discomfort with latching prior to this. I assisted mom with cross cradle hold, and after about 5 minutes, he began sucking, latched what appeared deeply, with good breast movement and deep chin excursions. Mom has easily expressed colostrum.  She denied any discomfort with this latch.  I did note a notched tip of his tongue with extension, and what appeared to be a loose, but almost anterior lingual frenulum. At first it seemed he could not extend his tongue over his gum line, but then he did. I informed Dr. Ave Filterhandler of my findings, and let her know that for now, the baby appears to be doing well. He has not voided yet, but had stooled twice.  I was going to try a   nipple shield , but mom had the baby latched well when I came back in the room.  Basic breast feeding teaching done with parents, and a lactation folder given to mom.     Maternal Data Formula Feeding for Exclusion: No Has patient been taught Hand Expression?: Yes Does the patient have breastfeeding experience prior to this delivery?: No  Feeding Feeding Type: Breast Fed Length of feed: 25 min (baby still actively sucking after 25 minutes, when I left the room)  LATCH Score/Interventions Latch: Repeated attempts needed to sustain latch, nipple held in mouth throughout feeding, stimulation needed to elicit sucking reflex. Intervention(s): Skin to skin;Teach feeding cues;Waking techniques Intervention(s): Adjust position;Assist with latch;Breast compression  Audible  Swallowing: A few with stimulation  Type of Nipple: Everted at rest and after stimulation  Comfort (Breast/Nipple): Soft / non-tender     Hold (Positioning): Assistance needed to correctly position infant at breast and maintain latch. Intervention(s): Breastfeeding basics reviewed;Support Pillows;Position options;Skin to skin  LATCH Score: 7  Lactation Tools Discussed/Used     Consult Status Consult Status: Follow-up Date: 04/10/17 Follow-up type: In-patient    [name], [name] 04/09/2017, 1:50 PM

## 2017-04-09 NOTE — Progress Notes (Signed)
POSTPARTUM PROGRESS NOTE  Post Partum Day 1 Subjective:  Nichole Hughes is a 31 y.o. G1P1001 10034w5d s/p NSVD.  No acute events overnight.  Pt denies problems with ambulating, voiding or po intake.  She denies nausea or vomiting.  Pain is well controlled.  She has had flatus. She has not had bowel movement.  Lochia Minimal.   Objective: Blood pressure (!) 96/51, pulse 63, temperature 98.4 F (36.9 C), temperature source Oral, resp. rate 16, height 5\' 2"  (1.575 m), weight 60.8 kg (134 lb), last menstrual period 07/04/2016, SpO2 99 %, unknown if currently breastfeeding.  Physical Exam:  General: alert, cooperative and no distress Lochia:normal flow Chest: CTAB Heart: RRR no m/r/g Abdomen: +BS, soft, nontender,  DVT Evaluation: No calf swelling or tenderness Extremities: no edema   Recent Labs  04/08/17 0254  HGB 13.1  HCT 38.6    Assessment/Plan:  ASSESSMENT: Nichole BondMaria Luvia Hughes is a 31 y.o. G1P1001 3134w5d s/p NSVD  Plan for discharge tomorrow   LOS: 1 day   Sullivan LoneBrannon L Inman, Medical Student   I confirm that I have verified the information documented in the resident's note and that I have also personally reperformed the physical exam and all medical decision making activities.

## 2017-04-10 MED ORDER — POLYETHYLENE GLYCOL 3350 17 G PO PACK: 17.0000 g | PACK | Freq: Every day | ORAL | 0 refills | Status: DC

## 2017-04-10 MED ORDER — IBUPROFEN 600 MG PO TABS: 600.0000 mg | ORAL_TABLET | Freq: Four times a day (QID) | ORAL | 0 refills | Status: DC

## 2017-04-10 MED ORDER — DOCUSATE SODIUM 100 MG PO CAPS: 100.0000 mg | ORAL_CAPSULE | Freq: Every day | ORAL | 2 refills | Status: AC | PRN

## 2017-04-10 NOTE — Lactation Note (Signed)
This note was copied from a baby's chart. Lactation Consultation Note  Patient Name: Nichole Sudie BaileyMaria Hughes WUJWJ'XToday's Date: 04/10/2017 Reason for consult: Follow-up assessment  Baby 39 hours old. Mom declined interpreter, FOB assisted. Mom reports that baby cluster-fed through the night and her nipples are sore. Mom nursing baby in modified cradle position. Assisted mom to turn baby chest-to-chest and support baby's head. Mom reported increased comfort, but states nipples are still sore. Enc mom to maintain a deep latch by supporting baby's head. Baby fully dressed and seems sleepy at the breast. Enc mom to undress baby if baby continues to be sleepy at breast. Mom reports that baby is still nursing because she can feel it. Discussed with mom that undressing the baby and having the baby more alert at the breast will assist with active nursing and reduce chance of nipple soreness. Enc mom to use EBM on nipples after nursing. Mom's nipple round/not pinched after nursing, and mom able to easily express breast milk and apply to nipples for comfort and healing.  Mom asked for empty bottles, and they were given. Referred mom to "Mother and Baby Care" booklet for EBM storage guidelines. Parents aware of OP/BFSG and LC phone line assistance after D/C. Updated baby's chart with feedings, output. FOB reports that baby's stool is transitioning to green/black. Discussed assessment and interventions with Dr. Ave Filterhandler, pediatrician.   Maternal Data    Feeding Feeding Type: Breast Fed Length of feed: 25 min  LATCH Score/Interventions Latch: Grasps breast easily, tongue down, lips flanged, rhythmical sucking. Intervention(s): Skin to skin;Waking techniques Intervention(s): Adjust position;Assist with latch;Breast compression  Audible Swallowing: A few with stimulation Intervention(s): Skin to skin;Hand expression  Type of Nipple: Everted at rest and after stimulation  Comfort (Breast/Nipple): Filling,  red/small blisters or bruises, mild/mod discomfort  Problem noted: Mild/Moderate discomfort Interventions (Mild/moderate discomfort): Hand expression  Hold (Positioning): Assistance needed to correctly position infant at breast and maintain latch. Intervention(s): Breastfeeding basics reviewed;Support Pillows;Position options;Skin to skin  LATCH Score: 7  Lactation Tools Discussed/Used     Consult Status Consult Status: PRN    Sherlyn HayJennifer D Venetia Prewitt 04/10/2017, 9:09 AM

## 2017-04-10 NOTE — Discharge Instructions (Signed)
Parto vaginal, cuidados de puerperio °(Postpartum Care After Vaginal Delivery) °El período de tiempo que sigue inmediatamente al parto se conoce como puerperio. °¿QUÉ TIPO DE ATENCIÓN MÉDICA RECIBIRÉ? °· Podría continuar recibiendo medicamentos y líquidos través de una vía intravenosa (IV) que se colocará en una de sus venas. °· Si se le realizó una incisión cerca de la vagina (episiotomía) o si ha tenido algún desgarro durante el parto, podrían indicarle que se coloque compresas frías sobre la episiotomía o el desgarro. Esto ayuda a aliviar el dolor y la hinchazón. °· Es posible que le den una botella rociadora para que use cuando vaya al baño. Puede utilizarla hasta que se sienta cómoda limpiándose de la manera habitual. Siga los pasos a continuación para usar la botella rociadora: °? Antes de orinar, llene la botella rociadora con agua tibia. No use agua caliente. °? Después de orinar, mientras aún está sentada en el inodoro, use la botella rociadora para enjuagar el área alrededor de la uretra y la abertura vaginal. Con esto podrá limpiar cualquier rastro de orina y sangre. °? Puede hacer esto en lugar de secarse. Cuando comience a sanar, podrá usar la botella rociadora antes de secarse. Asegúrese de secarse suavemente. °? Llene la botella rociadora con agua limpia cada vez que vaya al baño. °· Deberá usar apósitos sanitarios. °¿CÓMO PUEDO SENTIRME? °· Quizás no tenga necesidad de orinar durante varias horas después del parto. °· Sentirá algo de dolor y molestias en el abdomen y la vagina. °· Si está amamantando, podría tener contracciones uterinas cada vez que lo haga. Estas podrían prolongarse hasta varias semanas durante el puerperio. Las contracciones uterinas ayudan al útero a regresar a su tamaño habitual. °· Es normal tener un poco de hemorragia vaginal (loquios) después del parto. La cantidad y apariencia de los loquios a menudo es similar a las del período menstrual la primera semana después del parto.  Disminuirá gradualmente las siguientes semanas hasta convertirse en una descarga seca amarronada o amarillenta. En la mayoría de las mujeres, los loquios se detienen completamente entre 6 a 8 semanas después del parto. Los sangrados vaginales pueden variar de mujer a mujer. °· Los primeros días después del parto, podría padecer congestión mamaria. Los pechos se sentirán pesados, llenos y molestos. Las mamas también podrían latir y ponerse duras, muy tirantes, calientes y sensibles al tacto. Cuando esto ocurra, podría notar leche que se escapa de los senos. El médico puede recomendarle algunos métodos para aliviar este malestar causado por la congestión mamaria. La congestión mamaria debería desaparecer al cabo de unos días. °· Podría sentirse más deprimida o preocupada que lo habitual debido a los cambios hormonales luego del parto. Estos sentimientos no deben durar más de unos pocos días. Si no desaparecen al cabo de algunos días, hable con su médico. °¿QUÉ CUIDADOS DEBO TENER? °· Infórmele a su médico si siente dolor o malestar. °· Beba suficiente agua para mantener la orina clara o de color amarillo pálido. °· Lávese bien las manos con agua y jabón durante al menos 20 segundos después de cambiar el apósito sanitario, usar el baño o antes de sostener o alimentar al bebé. °· Si no está amamantando, evite tocarse mucho los senos. Al hacerlo, podrían producir más leche. °· Si se siente débil o mareada, o si siente que está a punto de desmayarse, pida ayuda antes de realizar lo siguiente: °? Levantarse de la cama. °? Ducharse. °· Cambie los apósitos sanitarios con frecuencia. Observe si hay cambios en el flujo, como un aumento repentino en el   volumen, cambios en el color o coágulos sanguíneos de gran tamaño. Si expulsa un coágulo sanguíneo por la vagina, guárdelo para mostrárselo a su médico. No tire la cadena sin que el médico examine el coágulo antes. °· Asegúrese de tener todas las vacunas al día. Esto la ayudará a  estar protegida y a proteger al bebé de determinadas enfermedades. Podría necesitar vacunas antes de dejar el hospital. °· Si lo desea, hable con el médico acerca de los métodos de planificación familiar o control de la natalidad (métodos anticonceptivos). °¿CÓMO PUEDO ESTABLECER LAZOS CON MI BEBÉ? °Pasar tanto tiempo como le sea posible con el bebé es sumamente importante. Durante ese tiempo, usted y su bebé pueden conocerse y desarrollar lazos. Tener al bebé con usted en la habitación le dará tiempo de conocerlo. Esto también puede hacerla sentir más cómoda para atender al bebé. Amamantar también puede ayudarla a crear lazos con el bebé. °¿CÓMO PUEDO PLANIFICAR MI REGRESO A CASA CON EL BEBÉ? °· Asegúrese de tener instalada una butaca en el automóvil. °? La butaca debe contar con la certificación del fabricante para asegurarse de que esté instalada en forma segura. °? Asegúrese de que el bebé quede bien asegurado en la butaca. °· Pregúntele al médico todo lo que necesite saber sobre los cuidados de su bebé. Asegúrese de poder comunicarse con el médico en caso de que tenga preguntas luego de dejar el hospital. °Esta información no tiene como fin reemplazar el consejo del médico. Asegúrese de hacerle al médico cualquier pregunta que tenga. °Document Released: 06/28/2007 Document Revised: 12/23/2015 Document Reviewed: 08/05/2015 °Elsevier Interactive Patient Education © 2018 Elsevier Inc. ° °

## 2017-04-10 NOTE — Discharge Summary (Addendum)
OB Discharge Summary     Patient Name: Nichole Hughes DOB: 01/29/1986 MRN: [id number]  Date of admission: 04/08/2017 Delivering MD: Donette LarryBHAMBRI, MELANIE   Date of discharge: 04/10/2017  Admitting diagnosis: 39 WEEKS CTX Intrauterine pregnancy: 7348w5d     Secondary diagnosis:  Principal Problem:   Normal labor Active Problems:   NSVD (normal spontaneous vaginal delivery)  Additional problems: none     Discharge diagnosis: Term Pregnancy Delivered                                                                                                Post partum procedures:none  Augmentation: AROM and Pitocin  Complications: None  Hospital course:  Onset of Labor With Vaginal Delivery     31 y.o. yo G1P1001 at 6248w5d was admitted in Active Labor on 04/08/2017. Patient had an uncomplicated labor course as follows:  Membrane Rupture Time/Date: 9:27 AM ,04/08/2017   Intrapartum Procedures: Episiotomy: None [1]                                         Lacerations:  2nd degree [3]  Patient had a delivery of a Viable infant. 04/08/2017  Information for the patient's newborn:  Nichole Hughes, Boy Nichole Hughes [[id number]][[id number]]  Delivery Method: Vag-Spont    Pateint had an uncomplicated postpartum course.  She is ambulating, tolerating a regular diet, passing flatus, and urinating well. Patient is discharged home in stable condition on 04/10/17.   Physical exam  Vitals:   04/08/17 2152 04/09/17 0235 04/09/17 1859 04/10/17 0533  BP: 114/75 (!) 96/51 (!) 100/58 (!) 98/52  Pulse: 69 63 80 74  Resp: 18 16 18 16   Temp: 98.5 F (36.9 C) 98.4 F (36.9 C) 98.4 F (36.9 C) 98.1 F (36.7 C)  TempSrc: Oral Oral Oral Oral  SpO2:   99% 99%  Weight:      Height:       General: alert, cooperative and no distress Lochia: appropriate Uterine Fundus: firm Incision: N/A DVT Evaluation: No evidence of DVT seen on physical exam. No significant calf/ankle edema. Labs: Lab Results  Component Value Date    WBC 12.7 (H) 04/08/2017   HGB 13.1 04/08/2017   HCT 38.6 04/08/2017   MCV 95.3 04/08/2017   PLT 270 04/08/2017   CMP Latest Ref Rng & Units 05/08/2016  Glucose 65 - 99 mg/dL 82  BUN 7 - 25 mg/dL 8  Creatinine 7.820.50 - 9.561.10 mg/dL 2.130.65  Sodium 086135 - 578146 mmol/L 138  Potassium 3.5 - 5.3 mmol/L 4.5  Chloride 98 - 110 mmol/L 105  CO2 20 - 31 mmol/L 23  Calcium 8.6 - 10.2 mg/dL 9.4  Total Protein 6.0 - 8.3 g/dL -  Total Bilirubin 0.3 - 1.2 mg/dL -  Alkaline Phos 39 - 469117 U/L -  AST 0 - 37 U/L -  ALT 0 - 35 U/L -    Discharge instruction: per After Visit Summary and "Baby and Me Booklet".  After visit meds:  Allergies as of  04/10/2017   No Known Allergies     Medication List    TAKE these medications   docusate sodium 100 MG capsule Commonly known as:  COLACE Take 1 capsule (100 mg total) by mouth daily as needed.   ibuprofen 600 MG tablet Commonly known as:  ADVIL,MOTRIN Take 1 tablet (600 mg total) by mouth every 6 (six) hours.   polyethylene glycol packet Commonly known as:  MIRALAX Take 17 g by mouth daily.   PRENATAL VITAMIN PO Take by mouth.       Diet: routine diet  Activity: Advance as tolerated. Pelvic rest for 6 weeks.   Outpatient follow up:6 weeks Follow up Appt:No future appointments. Follow up Visit:No Follow-up on file.  Postpartum contraception: Combination OCPs  Newborn Data: Live born female  Birth Weight: 7 lb 5.5 oz (3330 g) APGAR: 9, 9  Baby Feeding: Breast Disposition:home with mother   04/10/2017 Nichole PearJulie P Degele, MD  Attestation of Attending Supervision of Advanced Practitioner (CNM/NP): Evaluation and management procedures were performed by the Advanced Practitioner under my supervision and collaboration.  I have reviewed the Advanced Practitioner's note and chart, and I agree with the management and plan.  Nichole Hughes 04/10/2017 1:51 PM

## 2017-09-23 ENCOUNTER — Ambulatory Visit: Payer: Self-pay | Attending: Family Medicine | Admitting: Physician Assistant

## 2017-09-23 VITALS — BP 97/62 | HR 71 | Temp 98.2°F | Resp 18 | Ht 61.0 in | Wt 125.0 lb

## 2017-09-23 DIAGNOSIS — M67441 Ganglion, right hand: Secondary | ICD-10-CM

## 2017-09-23 DIAGNOSIS — M67431 Ganglion, right wrist: Secondary | ICD-10-CM | POA: Insufficient documentation

## 2017-09-23 NOTE — Progress Notes (Signed)
Patient ID: Nichole Hughes, female   DOB: 07/19/1986, 32 y.o.   MRN: [id number]      Nichole Hughes, is a 32 y.o. female  ZHY:[id number]  [id number]:[id number]  DOB - 1985/12/18  Subjective:  Chief Complaint and HPI: Nichole Hughes is a 32 y.o. female here today to for lump on R wrist for a couple of months that is getting bigger.  Some discomfort, but, not much. NKI.  Has 27 month old infant she is nursing.  She is R hand dominant.    Stratus interpreters "Marthenia Rolling"   ROS:   Constitutional:  No f/c, No night sweats, No unexplained weight loss. EENT:  No vision changes, No blurry vision, No hearing changes. No mouth, throat, or ear problems.  Respiratory: No cough, No SOB Cardiac: No CP, no palpitations GI:  No abd pain, No N/V/D. GU: No Urinary s/sx Musculoskeletal: No joint pain Neuro: No headache, no dizziness, no motor weakness.  Skin: No rash Endocrine:  No polydipsia. No polyuria.  Psych: Denies SI/HI  No problems updated.  ALLERGIES: No Known Allergies  PAST MEDICAL HISTORY: Past Medical History:  Diagnosis Date  . Medical history non-contributory     MEDICATIONS AT HOME: Prior to Admission medications   Medication Sig Start Date End Date Taking? Authorizing Provider  docusate sodium (COLACE) 100 MG capsule Take 1 capsule (100 mg total) by mouth daily as needed. 04/10/17 04/10/18 Yes Degele, Kandra Nicolas, MD  ibuprofen (ADVIL,MOTRIN) 600 MG tablet Take 1 tablet (600 mg total) by mouth every 6 (six) hours. 04/10/17  Yes Degele, Kandra Nicolas, MD  polyethylene glycol [facility]) packet Take 17 g by mouth daily. 04/10/17  Yes Degele, Kandra Nicolas, MD  Prenatal Vit-Fe Fumarate-FA (PRENATAL VITAMIN PO) Take by mouth.   Yes [provider]     Objective:  EXAM:   Vitals:   09/23/17 1606  BP: 97/62  Pulse: 71  Resp: 18  Temp: 98.2 F (36.8 C)  TempSrc: Oral  SpO2: 100%  Weight: 125 lb (56.7 kg)  Height: 5\' 1"  (1.549 m)    General appearance : A&OX3.  NAD. Non-toxic-appearing HEENT: Atraumatic and Normocephalic.  PERRLA. EOM intact.   Neck: supple, no JVD. No cervical lymphadenopathy. No thyromegaly Chest/Lungs:  Breathing-non-labored, Good air entry bilaterally, breath sounds normal without rales, rhonchi, or wheezing  CVS: S1 S2 regular, no murmurs, gallops, rubs  Extremities: Bilateral Lower Ext shows no edema, both legs are warm to touch with = pulse throughout.  Ganglion cyst ~1.5cm soft and mobile and non-tender on dorsum of R wrist.   Neurology:  CN II-XII grossly intact, Non focal.   Psych:  TP linear. J/I WNL. Normal speech. Appropriate eye contact and affect.  Skin:  No Rash  Data Review No results found for: HGBA1C   Assessment & Plan   1. Ganglion cyst of finger of right hand - Ambulatory referral to Hand Surgery Wrist splint 24/7 except to shower.  Can't do anti-inflammatories right now bc nursing(but not causing her much pain currently)  Patient have been counseled extensively about nutrition and exercise  Return if symptoms worsen or fail to improve.  The patient was given clear instructions to go to ER or return to medical center if symptoms don't improve, worsen or new problems develop. The patient verbalized understanding. The patient was told to call to get lab results if they haven't heard anything in the next week.     Georgian Co, PA-C [facility] [place], [place]  [phone]   09/23/2017, 4:34 PM

## 2017-09-23 NOTE — Patient Instructions (Addendum)
Wear splint night and day except to wash your hands/shower   Quiste ganglionar (Ganglion Cyst) Un quiste ganglionar es un bulto no canceroso lleno de lquido que se forma cerca de las articulaciones o los tendones. El quiste ganglionar crece fuera de una articulacin o de la membrana de un tendn. La Harley-Davidsonmayora de las veces aparece en la mano o la [place], pero tambin puede formarse en el hombro, el codo, la cadera, la rodilla, el tobillo o el pie. El quiste ganglionar redondo u ovalado puede tener el tamao de un guisante o ser ms grande que [place] uva. El incremento de la actividad puede aumentar su tamao porque empieza a acumularse ms lquido. CAUSAS Se desconoce qu causa el crecimiento de un quiste ganglionar. Sin embargo, puede guardar relacin con:  Inflamacin o irritacin alrededor de la articulacin.  Una lesin.  Los movimientos repetitivos o el uso excesivo.  Artritis. FACTORES DE RIESGO Entre los factores de riesgo se incluyen los siguientes:  Ser mujer.  Tener entre 20 y 6450aos. SIGNOS Y SNTOMAS Entre los sntomas se pueden incluir los siguientes:  Un bulto. Este aparece con ms frecuencia en la mano o la [place], pero tambin puede presentarse en otras zonas del cuerpo.  Hormigueo.  Dolor.  Entumecimiento.  Debilidad muscular.  Sujecin dbil.  Prdida del movimiento de Risk analystuna articulacin. DIAGNSTICO La mayora de las veces los quistes ganglionares se diagnostican en funcin de un examen fsico. El mdico palpar el bulto y puede iluminarlo para ver si la luz pasa a travs de [place]. Si es un quiste ganglionar, la luz suele traspasarlo. El mdico puede indicarle una radiografa, una ecografa o una resonancia magntica para descartar otras afecciones. TRATAMIENTO Generalmente, los quistes ganglionares desaparecen solos sin tratamiento. Si hay dolor u otros sntomas, tal vez se necesite tratamiento. Tambin es necesario un tratamiento si el quiste ganglionar limita  sus movimientos o si se infecta. El tratamiento puede incluir lo siguiente:  Usar una frula o una tablilla en la mueca o el dedo.  Medicamentos antiinflamatorios.  Extraer lquido del bulto con Neomia Dearuna aguja (aspiracin).  Inyectar un corticoide en la articulacin.  Ciruga para extirpar Psychologist, forensicel quiste ganglionar. INSTRUCCIONES PARA EL CUIDADO EN EL HOGAR  No haga presin Programmer, multimediasobre el quiste ganglionar, no lo pinche con una aguja ni lo golpee.  Tome los medicamentos solamente como se lo haya indicado el mdico.  Use la frula o la tablilla como se lo haya indicado el mdico.  Controle el quiste ganglionar para Insurance risk surveyordetectar cualquier cambio.  Concurra a todas las visitas de control como se lo haya indicado el mdico. Esto es importante. SOLICITE ATENCIN MDICA SI:  El quiste ganglionar se agranda o le provoca ms dolor.  Aumenta el enrojecimiento, las lneas rojas o la hinchazn.  Observa que sale pus del bulto.  Siente debilidad o adormecimiento alrededor de la zona afectada.  Tiene fiebre o siente escalofros. Esta informacin no tiene Theme park managercomo fin reemplazar el consejo del mdico. Asegrese de hacerle al mdico cualquier pregunta que tenga. Document Released: 06/10/2005 Document Revised: 09/21/2014 Document Reviewed: 02/13/2014 Elsevier Interactive Patient Education  [employer]2018 Elsevier Inc.

## 2017-09-28 ENCOUNTER — Ambulatory Visit (INDEPENDENT_AMBULATORY_CARE_PROVIDER_SITE_OTHER): Payer: Self-pay

## 2017-09-28 ENCOUNTER — Ambulatory Visit (INDEPENDENT_AMBULATORY_CARE_PROVIDER_SITE_OTHER): Payer: Self-pay | Admitting: Orthopaedic Surgery

## 2017-09-28 ENCOUNTER — Encounter (INDEPENDENT_AMBULATORY_CARE_PROVIDER_SITE_OTHER): Payer: Self-pay | Admitting: Orthopaedic Surgery

## 2017-09-28 DIAGNOSIS — M67431 Ganglion, right wrist: Secondary | ICD-10-CM

## 2017-09-28 MED ORDER — LIDOCAINE HCL 1 % IJ SOLN
0.3000 mL | INTRAMUSCULAR | Status: AC | PRN
  Administered 2017-09-28: .3 mL

## 2017-09-28 MED ORDER — METHYLPREDNISOLONE ACETATE 40 MG/ML IJ SUSP
13.3300 mg | INTRAMUSCULAR | Status: AC | PRN
  Administered 2017-09-28: 13.33 mg

## 2017-09-28 MED ORDER — BUPIVACAINE HCL 0.5 % IJ SOLN
0.3300 mL | INTRAMUSCULAR | Status: AC | PRN
  Administered 2017-09-28: .33 mL

## 2017-09-28 NOTE — Progress Notes (Signed)
Office Visit Note   Patient: Nichole Hughes           Date of Birth: Feb 02, 1986           MRN: [id number] Visit Date: 09/28/2017              Requested by: Anders Simmonds, PA-C [address] [place], [place] [address] PCP: Jaclyn Shaggy, MD   Assessment & Plan: Visit Diagnoses:  1. Ganglion cyst of dorsum of right wrist     Plan: Impression is a 32 year old female with dorsal wrist ganglion cyst.  This was aspirated and then injected with cortisone today.  Wrist brace for 2 weeks and then wean as tolerated.  Questions encouraged and answered.  Follow-up as needed.  Follow-Up Instructions: Return if symptoms worsen or fail to improve.   Orders:  Orders Placed This Encounter  Procedures  . XR Wrist Complete Right   No orders of the defined types were placed in this encounter.     Procedures: Hand/UE Inj: R wrist for dorsal carpal ganglion on 09/28/2017 5:59 PM Indications: pain Details: 18 G needle Medications: 0.33 mL bupivacaine 0.5 %; 0.3 mL lidocaine 1 %; 13.33 mg methylPREDNISolone acetate 40 MG/ML Aspirate: 5 mL clear Outcome: tolerated well, no immediate complications Patient was prepped and draped in the usual sterile fashion.       Clinical Data: No additional findings.   Subjective: Chief Complaint  Patient presents with  . Right Wrist - Cyst    Patient is a 32 year old female who comes in with one-month history of dorsal wrist ganglion cyst.  She endorses discomfort with use of the wrist.  Denies any injury or numbness and tingling or radiation of pain.    Review of Systems  Constitutional: Negative.   HENT: Negative.   Eyes: Negative.   Respiratory: Negative.   Cardiovascular: Negative.   Endocrine: Negative.   Musculoskeletal: Negative.   Neurological: Negative.   Hematological: Negative.   Psychiatric/Behavioral: Negative.   All other systems reviewed and are negative.    Objective: Vital Signs: There were no vitals  taken for this visit.  Physical Exam  Constitutional: She is oriented to person, place, and time. She appears well-developed and well-nourished.  HENT:  Head: Normocephalic and atraumatic.  Eyes: EOM are normal.  Neck: Neck supple.  Pulmonary/Chest: Effort normal.  Abdominal: Soft.  Neurological: She is alert and oriented to person, place, and time.  Skin: Skin is warm. Capillary refill takes less than 2 seconds.  Psychiatric: She has a normal mood and affect. Her behavior is normal. Judgment and thought content normal.  Nursing note and vitals reviewed.   Ortho Exam Right hand exam shows a palpable dorsal ganglion cyst that transilluminates with light.  There is no worrisome clinical features.  This is slightly tender to palpation. Specialty Comments:  No specialty comments available.  Imaging: Xr Wrist Complete Right  Result Date: 09/28/2017 No acute or structural abnormalities.    PMFS History: Patient Active Problem List   Diagnosis Date Noted  . Normal labor 04/08/2017  . NSVD (normal spontaneous vaginal delivery) 04/08/2017  . LGSIL (low grade squamous intraepithelial dysplasia) 10/17/2014  . Preventative health care 06/26/2013   Past Medical History:  Diagnosis Date  . Medical history non-contributory     Family History  Problem Relation Age of Onset  . Diabetes Mother     Past Surgical History:  Procedure Laterality Date  . NO PAST SURGERIES     Social History  Occupational History    Employer: [employer]     Comment: [place]  Tobacco Use  . Smoking status: Never Smoker  . Smokeless tobacco: Never Used  Substance and Sexual Activity  . Alcohol use: No  . Drug use: No  . Sexual activity: Not on file

## 2018-02-16 ENCOUNTER — Ambulatory Visit: Payer: Self-pay | Attending: Family Medicine

## 2018-04-06 ENCOUNTER — Encounter: Payer: Self-pay | Admitting: Family Medicine

## 2018-04-06 ENCOUNTER — Ambulatory Visit: Payer: Self-pay | Attending: Family Medicine | Admitting: Family Medicine

## 2018-04-06 VITALS — BP 108/72 | HR 79 | Temp 98.0°F | Ht 61.0 in | Wt 130.2 lb

## 2018-04-06 DIAGNOSIS — M6740 Ganglion, unspecified site: Secondary | ICD-10-CM

## 2018-04-06 DIAGNOSIS — Z79899 Other long term (current) drug therapy: Secondary | ICD-10-CM | POA: Insufficient documentation

## 2018-04-06 DIAGNOSIS — M67431 Ganglion, right wrist: Secondary | ICD-10-CM | POA: Insufficient documentation

## 2018-04-06 DIAGNOSIS — L659 Nonscarring hair loss, unspecified: Secondary | ICD-10-CM

## 2018-04-06 NOTE — Progress Notes (Signed)
Subjective:  Patient ID: Nichole Hughes, female    DOB: 08-10-1986  Age: 32 y.o. MRN: [id number]  CC: Cyst   HPI Nichole Hughes [facility] presents complaining of right wrist swelling which has been present for the last 6 months and is not painful except when pressure is applied.  She has undergone aspiration previously but it only reaccumulated. She is also concerned about patchy hair loss in the center of her head for the last 6 months.  Denies itching or scaling of her scalp ; of note she had a baby 1 year ago.  Past Medical History:  Diagnosis Date  . Medical history non-contributory     Past Surgical History:  Procedure Laterality Date  . NO PAST SURGERIES      No Known Allergies   Outpatient Medications Prior to Visit  Medication Sig Dispense Refill  . docusate sodium (COLACE) 100 MG capsule Take 1 capsule (100 mg total) by mouth daily as needed. (Patient not taking: Reported on 04/06/2018) 30 capsule 2  . ibuprofen (ADVIL,MOTRIN) 600 MG tablet Take 1 tablet (600 mg total) by mouth every 6 (six) hours. (Patient not taking: Reported on 04/06/2018) 30 tablet 0  . polyethylene glycol (MIRALAX) packet Take 17 g by mouth daily. (Patient not taking: Reported on 04/06/2018) 30 each 0  . Prenatal Vit-Fe Fumarate-FA (PRENATAL VITAMIN PO) Take by mouth.     No facility-administered medications prior to visit.     ROS Review of Systems  Constitutional: Negative for activity change, appetite change and fatigue.  HENT: Negative for congestion, sinus pressure and sore throat.   Eyes: Negative for visual disturbance.  Respiratory: Negative for cough, chest tightness, shortness of breath and wheezing.   Cardiovascular: Negative for chest pain and palpitations.  Gastrointestinal: Negative for abdominal distention, abdominal pain and constipation.  Endocrine: Negative for polydipsia.  Genitourinary: Negative for dysuria and frequency.  Musculoskeletal: Negative for  arthralgias and back pain.  Skin:       See hpi  Neurological: Negative for tremors, light-headedness and numbness.  Hematological: Does not bruise/bleed easily.  Psychiatric/Behavioral: Negative for agitation and behavioral problems.    Objective:  BP 108/72   Pulse 79   Temp 98 F (36.7 C) (Oral)   Ht 5\' 1"  (1.549 m)   Wt 130 lb 3.2 oz (59.1 kg)   SpO2 99%   BMI 24.60 kg/m   BP/Weight 04/06/2018 09/23/2017 04/10/2017  Systolic BP 108 97 98  Diastolic BP 72 62 52  Wt. (Lbs) 130.2 125 -  BMI 24.6 23.62 -     Physical Exam  Constitutional: She is oriented to person, place, and time. She appears well-developed and well-nourished.  Cardiovascular: Normal rate, normal heart sounds and intact distal pulses.  No murmur heard. Pulmonary/Chest: Effort normal and breath sounds normal. She has no wheezes. She has no rales. She exhibits no tenderness.  Abdominal: Soft. Bowel sounds are normal. She exhibits no distension and no mass. There is no tenderness.  Musculoskeletal: Normal range of motion.  Cyst on dorsum of right hand adjacent to wrist  Neurological: She is alert and oriented to person, place, and time.  Skin: Skin is warm and dry.  Scanty hair in central aspect of head  Psychiatric: She has a normal mood and affect.     Assessment & Plan:   1. Ganglion cyst Reassurance provided  2. Alopecia Cannot exclude telogen effluvium given recent childbirth but will check thyroid panel - T4, free - TSH   No  orders of the defined types were placed in this encounter.   Follow-up: Return in about 1 month (around 05/07/2018), or if symptoms worsen or fail to improve, for Complete physical exam and fasting labs.   Hoy RegisterEnobong Sidney Kann MD

## 2018-04-06 NOTE — Patient Instructions (Signed)
Ganglion Cyst A ganglion cyst is a noncancerous, fluid-filled lump that occurs near joints or tendons. The ganglion cyst grows out of a joint or the lining of a tendon. It most often develops in the hand or wrist, but it can also develop in the shoulder, elbow, hip, knee, ankle, or foot. The round or oval ganglion cyst can be the size of a pea or larger than a grape. Increased activity may enlarge the size of the cyst because more fluid starts to build up. What are the causes? It is not known what causes a ganglion cyst to grow. However, it may be related to:  Inflammation or irritation around the joint.  An injury.  Repetitive movements or overuse.  Arthritis.  What increases the risk? Risk factors include:  Being a woman.  Being age 20-50.  What are the signs or symptoms? Symptoms may include:  A lump. This most often appears on the hand or wrist, but it can occur in other areas of the body.  Tingling.  Pain.  Numbness.  Muscle weakness.  Weak grip.  Less movement in a joint.  How is this diagnosed? Ganglion cysts are most often diagnosed based on a physical exam. Your health care provider will feel the lump and may shine a light alongside it. If it is a ganglion cyst, a light often shines through it. Your health care provider may order an X-ray, ultrasound, or MRI to rule out other conditions. How is this treated? Ganglion cysts usually go away on their own without treatment. If pain or other symptoms are involved, treatment may be needed. Treatment is also needed if the ganglion cyst limits your movement or if it gets infected. Treatment may include:  Wearing a brace or splint on your wrist or finger.  Taking anti-inflammatory medicine.  Draining fluid from the lump with a needle (aspiration).  Injecting a steroid into the joint.  Surgery to remove the ganglion cyst.  Follow these instructions at home:  Do not press on the ganglion cyst, poke it with a  needle, or hit it.  Take medicines only as directed by your health care provider.  Wear your brace or splint as directed by your health care provider.  Watch your ganglion cyst for any changes.  Keep all follow-up visits as directed by your health care provider. This is important. Contact a health care provider if:  Your ganglion cyst becomes larger or more painful.  You have increased redness, red streaks, or swelling.  You have pus coming from the lump.  You have weakness or numbness in the affected area.  You have a fever or chills. This information is not intended to replace advice given to you by your health care provider. Make sure you discuss any questions you have with your health care provider. Document Released: 08/28/2000 Document Revised: 02/06/2016 Document Reviewed: 02/13/2014 Elsevier Interactive Patient Education  2018 Elsevier Inc.  

## 2018-04-07 LAB — T4, FREE: Free T4: 1.2 ng/dL (ref 0.82–1.77)

## 2018-04-07 LAB — TSH: TSH: 1.34 u[IU]/mL (ref 0.450–4.500)

## 2018-04-08 ENCOUNTER — Telehealth: Payer: Self-pay

## 2018-04-08 NOTE — Telephone Encounter (Signed)
Patient was called and informed of lab results via interpretor([id number]). 

## 2018-10-18 ENCOUNTER — Ambulatory Visit: Payer: Self-pay | Attending: Family Medicine

## 2018-11-15 ENCOUNTER — Encounter: Payer: Self-pay | Admitting: Family Medicine

## 2018-11-15 ENCOUNTER — Ambulatory Visit: Payer: Self-pay | Attending: Family Medicine | Admitting: Family Medicine

## 2018-11-15 VITALS — BP 112/76 | HR 70 | Temp 97.7°F | Ht 61.0 in | Wt 138.4 lb

## 2018-11-15 DIAGNOSIS — Z124 Encounter for screening for malignant neoplasm of cervix: Secondary | ICD-10-CM

## 2018-11-15 DIAGNOSIS — Z0000 Encounter for general adult medical examination without abnormal findings: Secondary | ICD-10-CM

## 2018-11-15 NOTE — Patient Instructions (Signed)
[place] (Health Maintenance, Female) Un estilo de vida saludable y los cuidados preventivos pueden favorecer considerablemente a la salud y Musician. Pregunte a su mdico cul es el cronograma de exmenes peridicos apropiado para usted. Esta es una buena oportunidad para consultarlo sobre cmo prevenir enfermedades y [place] sano. Adems de los controles, hay muchas otras cosas que puede hacer usted mismo. Los expertos han realizado numerosas investigaciones [employer] cambios en el estilo de vida y las medidas de prevencin que, [place], lo ayudarn a mantenerse sano. Solicite a su mdico ms informacin. EL PESO Y LA DIETA Consuma una dieta saludable.  Asegrese de [employer] verduras, frutas, productos lcteos de bajo contenido de [place] y Advertising account planner.  No consuma muchos alimentos de alto contenido de grasas slidas, azcares agregados o sal.  Realice actividad fsica con regularidad. Esta es una de las prcticas ms importantes que puede hacer por su salud. ? La Delorise Shiner de los adultos deben hacer ejercicio durante al menos 124mnutos por semana. El ejercicio debe aumentar la frecuencia cardaca y pActorla transpiracin (ejercicio de [place]. ? La mayora de los adultos tambin deben hacer ejercicios de elongacin al mToysRusveces a la semana. Agregue esto al su plan de ejercicio de intensidad moderada. Mantenga un peso saludable.  El ndice de masa corporal ([facility] es una medida que puede utilizarse para identificar posibles problemas de pEast [place]town Proporciona una estimacin de la grasa corporal basndose en el peso y la altura. Su mdico puede ayudarle a dRadiation protection practitionerISouth Endy a lScientist, forensico mTheatre managerun peso saludable.  Para las mujeres de 20aos o ms: ? Un IJohn R. Oishei Children'S Hospitalmenor de 18,5 se considera bajo peso. ? Un ICumberland County Hospitalentre 18,5 y 24,9 es normal. ? Un IPelham Medical Centerentre 25 y 29,9 se considera sobrepeso. ? Un IMC de 30 o ms se considera  obesidad. Observe los niveles de colesterol y lpidos en la sangre.  Debe comenzar a rEnglish as a second language teacherde lpidos y cResearch officer, trade unionen la sangre a los 20aos y luego repetirlos cada 516aos  Es posible que nAutomotive engineerlos niveles de colesterol con mayor frecuencia si: ? Sus niveles de lpidos y colesterol son altos. ? Es mayor de 527CWC ? Presenta un alto riesgo de padecer enfermedades cardacas. DETECCIN DE CNCER Cncer de pulmn  Se recomienda realizar exmenes de deteccin de cncer de pulmn a personas adultas entre 574y 892aos que estn en riesgo de dHorticulturist, commercialde pulmn por sus antecedentes de consumo de tabaco.  Se recomienda una tomografa computarizada de baja dosis de los pulmones todos los aos a las personas que: ? Fuman actualmente. ? Hayan dejado el hbito en algn momento en los ltimos 15aos. ? Hayan fumado durante 30aos un paquete diario. Un paquete-ao equivale a fumar un promedio de un paquete de cigarrillos diario durante un ao.  Los exmenes de deteccin anuales deben continuar hasta que hayan pasado 15aos desde que dej de fumar.  Ya no debern realizarse si tiene un problema de salud que le impida recibir tratamiento para eScience writerde pulmn. Cncer de mama  Practique la autoconciencia de la mama. Esto significa reconocer la apariencia normal de sus mamas y cmo las siente.  Tambin significa realizar autoexmenes regulares de lJohnson & Johnson Informe a su mdico sobre cualquier cambio, sin importar cun pequeo sea.  Si tiene entre 20 y 363aos, un mdico debe realizarle un examen clnico de las mamas como parte del examen regular de s[place] cada 1 a  3aos.  Si tiene 40aos o ms, debe realizarse un examen clnico de las mamas todos los aos. Tambin considere realizarse una radiografa de las mamas (mamografa) todos los aos.  Si tiene antecedentes familiares de cncer de mama, hable con su mdico para someterse a un estudio gentico.  Si  tiene alto riesgo de padecer cncer de mama, hable con su mdico para someterse a una resonancia magntica y una mamografa todos los aos.  La evaluacin del gen del cncer de mama (BRCA) se recomienda a mujeres que tengan familiares con cnceres relacionados con el BRCA. Los cnceres relacionados con el BRCA incluyen los siguientes: ? Mama. ? Ovario. ? Trompas. ? Cnceres de peritoneo.  Los resultados de la evaluacin determinarn la necesidad de asesoramiento gentico y de anlisis de BRCA1 y BRCA2. Cncer de cuello del tero El mdico puede recomendarle que se haga pruebas peridicas de deteccin de cncer de los rganos de la pelvis (ovarios, tero y vagina). Estas pruebas incluyen un examen plvico, que abarca controlar si se produjeron cambios microscpicos en la superficie del cuello del tero (prueba de Papanicolaou). Pueden recomendarle que se haga estas pruebas cada 3aos, a partir de los 21aos.  A las mujeres que tienen entre 30 y 65aos, los mdicos pueden recomendarles que se sometan a exmenes plvicos y pruebas de Papanicolaou cada 3aos, o a la prueba de Papanicolaou y el examen plvico en combinacin con estudios de deteccin del virus del papiloma humano (VPH) cada 5aos. Algunos tipos de VPH aumentan el riesgo de padecer cncer de cuello del tero. La prueba para la deteccin del VPH tambin puede realizarse a mujeres de cualquier edad cuyos resultados de la prueba de Papanicolaou no sean claros.  Es posible que otros mdicos no recomienden exmenes de deteccin a mujeres no embarazadas que se consideran sujetos de bajo riesgo de padecer cncer de pelvis y que no tienen sntomas. Pregntele al mdico si un examen plvico de deteccin es adecuado para usted.  Si ha recibido un tratamiento para el cncer cervical o una enfermedad que podra causar cncer, necesitar realizarse una prueba de Papanicolaou y controles durante al menos 20 aos de concluido el tratamiento. Si no se  ha hecho el Papanicolaou con regularidad, debern volver a evaluarse los factores de riesgo (como tener un nuevo compaero sexual), para determinar si debe realizarse los estudios nuevamente. Algunas mujeres sufren problemas mdicos que aumentan la probabilidad de contraer cncer de cuello del tero. En estos casos, el mdico podr indicar que se realicen controles y pruebas de Papanicolaou con ms frecuencia. Cncer colorrectal  Este tipo de cncer puede detectarse y a menudo prevenirse.  Por lo general, los estudios de rutina se deben comenzar a hacer a partir de los 50 aos y hasta los 75 aos.  Sin embargo, el mdico podr aconsejarle que lo haga antes, si tiene factores de riesgo para el cncer de colon.  Tambin puede recomendarle que use un kit de prueba para hallar sangre oculta en la materia fecal.  Es posible que se use una pequea cmara en el extremo de un tubo para examinar directamente el colon (sigmoidoscopia o colonoscopia) a fin de detectar formas tempranas de cncer colorrectal.  Los exmenes de rutina generalmente comienzan a los 50aos.  El examen directo del colon se debe repetir cada 5 a 10aos hasta los 75aos. Sin embargo, es posible que se realicen exmenes con mayor frecuencia, si se detectan formas tempranas de plipos precancerosos o pequeos bultos. Cncer de piel  Revise la piel   de la cabeza a los pies con regularidad.  Informe a su mdico si aparecen nuevos lunares o los que tiene se modifican, especialmente en su forma y color.  Tambin notifique al mdico si tiene un lunar que es ms grande que el tamao de una goma de lpiz.  Siempre use pantalla solar. Aplique pantalla solar de manera libre y repetida a lo largo del da.  Protjase usando mangas y pantalones largos, un sombrero de ala ancha y gafas para el sol, siempre que se encuentre en el exterior. ENFERMEDADES CARDACAS, DIABETES E HIPERTENSIN ARTERIAL  La hipertensin arterial causa  enfermedades cardacas y aumenta el riesgo de ictus. La hipertensin arterial es ms probable en los siguientes casos: ? Las personas que tienen la presin arterial en el extremo del rango normal (100-139/85-89 mm Hg). ? Las personas con sobrepeso u obesidad. ? Las personas afroamericanas.  Si usted tiene entre 18 y 39 aos, debe medirse la presin arterial cada 3 a 5 aos. Si usted tiene 40 aos o ms, debe medirse la presin arterial todos los aos. Debe medirse la presin arterial dos veces: una vez cuando est en un hospital o una clnica y la otra vez cuando est en otro sitio. Registre el promedio de las dos mediciones. Para controlar su presin arterial cuando no est en un hospital o una clnica, puede usar lo siguiente: ? Una mquina automtica para medir la presin arterial en una farmacia. ? Un monitor para medir la presin arterial en el hogar.  Si tiene entre 55 y 79 aos, consulte a su mdico si debe tomar aspirina para prevenir el ictus.  Realcese exmenes de deteccin de la diabetes con regularidad. Esto incluye la toma de una muestra de sangre para controlar el nivel de azcar en la sangre durante el ayuno. ? Si tiene un peso normal y un bajo riesgo de padecer diabetes, realcese este anlisis cada tres aos despus de los 45aos. ? Si tiene sobrepeso y un alto riesgo de padecer diabetes, considere someterse a este anlisis antes o con mayor frecuencia. PREVENCIN DE INFECCIONES HepatitisB  Si tiene un riesgo ms alto de contraer hepatitis B, debe someterse a un examen de deteccin de este virus. Se considera que tiene un alto riesgo de contraer hepatitis B si: ? Naci en un pas donde la hepatitis B es frecuente. Pregntele a su mdico qu pases son considerados de alto riesgo. ? Sus padres nacieron en un pas de alto riesgo y usted no recibi una vacuna que lo proteja contra la hepatitis B (vacuna contra la hepatitis B). ? Tiene VIH o sida. ? Usa agujas para inyectarse  drogas. ? Vive con alguien que tiene hepatitis B. ? Ha tenido sexo con alguien que tiene hepatitis B. ? Recibe tratamiento de hemodilisis. ? Toma ciertos medicamentos para el cncer, trasplante de rganos y afecciones autoinmunitarias. Hepatitis C  Se recomienda un anlisis de sangre para: ? Todos los que nacieron entre 1945 y 1965. ? Todas las personas que tengan un riesgo de haber contrado hepatitis C. Enfermedades de transmisin sexual (ETS).  Debe realizarse pruebas de deteccin de enfermedades de transmisin sexual (ETS), incluidas gonorrea y clamidia si: ? Es sexualmente activo y es menor de 24aos. ? Es mayor de 24aos, y el mdico le informa que corre riesgo de tener este tipo de infecciones. ? La actividad sexual ha cambiado desde que le hicieron la ltima prueba de deteccin y tiene un riesgo mayor de tener clamidia o gonorrea. Pregntele al mdico si usted   tiene riesgo.  Si no tiene el VIH, pero corre riesgo de infectarse por el virus, se recomienda tomar diariamente un medicamento recetado para evitar la infeccin. Esto se conoce como profilaxis previa a la exposicin. Se considera que est en riesgo si: ? Es [place] sexualmente y no [place] preservativos habitualmente o no conoce el estado del VIH de sus Advertising copywriter. ? Se inyecta drogas. ? Es [place] sexualmente con Ardelia Mems pareja que tiene VIH. Consulte a su mdico para saber si tiene un alto riesgo de infectarse por el VIH. Si opta por comenzar la profilaxis previa a la exposicin, primero debe realizarse anlisis de deteccin del VIH. Luego, le harn anlisis cada 54mses mientras est tomando los medicamentos para la profilaxis previa a la exposicin. E[facility] Si es premenopusica y puede quedar eArbutus solicite a su mdico asesoramiento previo a la concepcin.  Si puede quedar embarazada, tome 400 a [id number]([id number]) de cido fAnheuser-Busch  Si desea evitar el embarazo, hable con su mdico sobre el  control de la natalidad (anticoncepcin). OSTEOPOROSIS Y MENOPAUSIA  La osteoporosis es una enfermedad en la que los huesos pierden los minerales y la fuerza por el avance de la edad. El resultado pueden ser fracturas graves en los hLeaf River El riesgo de osteoporosis puede identificarse con uArdelia Memsprueba de densidad sea.  Si tiene 65aos o ms, o si est en riesgo de sufrir osteoporosis y fracturas, pregunte a su mdico si debe someterse a exmenes.  Consulte a su mdico si debe tomar un suplemento de calcio o de vitamina D para reducir el riesgo de osteoporosis.  La menopausia puede presentar ciertos sntomas fsicos y rGaffer  La terapia de reemplazo hormonal puede reducir algunos de estos sntomas y rGaffer Consulte a su mdico para saber si la terapia de reemplazo hormonal es conveniente para usted. INSTRUCCIONES PARA EL CUIDADO EN EL HOGAR  Realcese los estudios de rutina de la salud, dentales y de lPublic librarian  MKennard  No consuma ningn producto que contenga tabaco, lo que incluye cigarrillos, tabaco de mHigher education careers advisero cPsychologist, sport and exercise  Si est embarazada, no beba alcohol.  Si est amamantando, reduzca el consumo de alcohol y la frecuencia con la que consume.  Si es mujer y no est embarazada limite el consumo de alcohol a no ms de 1 medida por da. Una medida equivale a 12onzas de cerveza, 5onzas de vino o 1onzas de bebidas alcohlicas de alta graduacin.  No consuma drogas.  No comparta agujas.  Solicite ayuda a su mdico si necesita apoyo o informacin para abandonar las drogas.  Informe a su mdico si a menudo se siente deprimido.  Notifique a su mdico si alguna vez ha sido vctima de abuso o si no se siente seguro en su hogar. Esta informacin no tiene cMarine scientistel consejo del mdico. Asegrese de hacerle al mdico cualquier pregunta que tenga. Document Released: 08/20/2011 Document Revised: 09/21/2014 Document Reviewed:  06/04/2015 Elsevier Interactive Patient Education  2019 [employer]Re[employer]

## 2018-11-15 NOTE — Progress Notes (Signed)
 Subjective:  Patient ID: Nichole Hughes, female    DOB: 08/28/1986  Age: 32 y.o. MRN: [id number]  CC: Annual Exam and Gynecologic Exam   HPI Nichole Hughes presents for complete physical exam. She has no complaints today. Last Pap smear was in 10/28/2015 and was normal.  Past Medical History:  Diagnosis Date  . Medical history non-contributory     Past Surgical History:  Procedure Laterality Date  . NO PAST SURGERIES      Family History  Problem Relation Age of Onset  . Diabetes Mother     No Known Allergies  Outpatient Medications Prior to Visit  Medication Sig Dispense Refill  . Prenatal Vit-Fe Fumarate-FA (PRENATAL VITAMIN PO) Take by mouth.    . ibuprofen (ADVIL,MOTRIN) 600 MG tablet Take 1 tablet (600 mg total) by mouth every 6 (six) hours. (Patient not taking: Reported on 04/06/2018) 30 tablet 0  . polyethylene glycol (MIRALAX) packet Take 17 g by mouth daily. (Patient not taking: Reported on 04/06/2018) 30 each 0   No facility-administered medications prior to visit.      ROS Review of Systems General: negative for fever, weight loss, appetite change Eyes: no visual symptoms. ENT: no ear symptoms, no sinus tenderness, no nasal congestion or sore throat. Neck: no pain  Respiratory: no wheezing, shortness of breath, cough Cardiovascular: no chest pain, no dyspnea on exertion, no pedal edema, no orthopnea. Gastrointestinal: no abdominal pain, no diarrhea, no constipation Genito-Urinary: no urinary frequency, no dysuria, no polyuria. Hematologic: no bruising Endocrine: no cold or heat intolerance Neurological: no headaches, no seizures, no tremors Musculoskeletal: no joint pains, no joint swelling Skin: no pruritus, no rash. Psychological: no depression, no anxiety,    Objective:  BP 112/76   Pulse 70   Temp 97.7 F (36.5 C) (Oral)   Ht 5' 1" (1.549 m)   Wt 138 lb 6.4 oz (62.8 kg)   SpO2 98%   BMI 26.15 kg/m   BP/Weight  11/15/2018 04/06/2018 09/23/2017  Systolic BP 112 108 97  Diastolic BP 76 72 62  Wt. (Lbs) 138.4 130.2 125  BMI 26.15 24.6 23.62      Physical Exam Constitutional: normal appearing,  Eyes: PERRLA HEENT: Head is atraumatic, normal sinuses, normal oropharynx, normal appearing tonsils and palate, tympanic membrane is normal bilaterally. Neck: normal range of motion, no thyromegaly, no JVD Cardiovascular: normal rate and rhythm, normal heart sounds, no murmurs, rub or gallop, no pedal edema Respiratory: Normal breath sounds, clear to auscultation bilaterally, no wheezes, no rales, no rhonchi Breasts: Normal appearance, no tenderness, no masses Abdomen: soft, not tender to palpation, normal bowel sounds, no enlarged organs Musculoskeletal: Full ROM, no tenderness in joints Genitourinary: Normal external genitalia, normal vagina, normal cervix, no cervical motion tenderness, normal adnexa Skin: warm and dry, no lesions. Neurological: alert, oriented x3, cranial nerves I-XII grossly intact , normal motor strength, normal sensation. Psychological: normal mood.   CMP Latest Ref Rng & Units 05/08/2016 10/31/2015 09/23/2012  Glucose 65 - 99 mg/dL 82 90 84  BUN 7 - 25 mg/dL 8 12 9  Creatinine 0.50 - 1.10 mg/dL 0.65 0.63 0.50  Sodium 135 - 146 mmol/L 138 137 140  Potassium 3.5 - 5.3 mmol/L 4.5 5.0 3.8  Chloride 98 - 110 mmol/L 105 105 99  CO2 20 - 31 mmol/L 23 23 24  Calcium 8.6 - 10.2 mg/dL 9.4 9.2 9.8  Total Protein 6.0 - 8.3 g/dL - - 8.5(H)  Total Bilirubin 0.3 - 1.2 mg/dL - -   0.3  Alkaline Phos 39 - 117 U/L - - 69  AST 0 - 37 U/L - - 19  ALT 0 - 35 U/L - - 22    Lipid Panel     Component Value Date/Time   CHOL 194 10/31/2015 0921   TRIG 54 10/31/2015 0921   HDL 70 10/31/2015 0921   CHOLHDL 2.8 10/31/2015 0921   VLDL 11 10/31/2015 0921   LDLCALC 113 10/31/2015 0921    CBC    Component Value Date/Time   WBC 12.7 (H) 04/08/2017 0254   RBC 4.05 04/08/2017 0254   HGB 13.1  04/08/2017 0254   HGB 13.3 10/14/2012 1045   HCT 38.6 04/08/2017 0254   HCT 39.8 10/14/2012 1045   PLT 270 04/08/2017 0254   PLT 275 10/14/2012 1045   MCV 95.3 04/08/2017 0254   MCV 94.6 10/14/2012 1045   MCH 32.3 04/08/2017 0254   MCHC 33.9 04/08/2017 0254   RDW 14.4 04/08/2017 0254   RDW 12.0 10/14/2012 1045   LYMPHSABS 1,924 05/08/2016 1026   LYMPHSABS 2.8 10/14/2012 1045   MONOABS 370 05/08/2016 1026   MONOABS 0.6 10/14/2012 1045   EOSABS 74 05/08/2016 1026   EOSABS 0.1 10/14/2012 1045   BASOSABS 0 05/08/2016 1026   BASOSABS 0.0 10/14/2012 1045      Assessment & Plan:   1. Annual physical exam Counseled on 150 minutes of exercise per week, healthy eating (including decreased daily intake of saturated fats, cholesterol, added sugars, sodium), STI prevention, routine healthcare maintenance. - CMP14+EGFR; Future - Lipid panel; Future - Cervicovaginal ancillary only  2. Screening for cervical cancer - Cytology - PAP([place])   No orders of the defined types were placed in this encounter.   Follow-up: Return in about 1 year (around 11/15/2019) for annual physical.       Charlott Rakes, MD, FAAFP. [facility] [place], [place]   11/15/2018, 10:49 AM

## 2018-11-16 ENCOUNTER — Ambulatory Visit: Payer: Self-pay | Attending: Family Medicine

## 2018-11-16 DIAGNOSIS — Z0000 Encounter for general adult medical examination without abnormal findings: Secondary | ICD-10-CM

## 2018-11-16 NOTE — Progress Notes (Signed)
Pt here for lab visit 

## 2018-11-17 LAB — CMP14+EGFR
ALT: 21 IU/L (ref 0–32)
AST: 18 IU/L (ref 0–40)
Albumin/Globulin Ratio: 2 (ref 1.2–2.2)
Albumin: 4.7 g/dL (ref 3.8–4.8)
Alkaline Phosphatase: 59 IU/L (ref 39–117)
BILIRUBIN TOTAL: 0.5 mg/dL (ref 0.0–1.2)
BUN/Creatinine Ratio: 14 (ref 9–23)
BUN: 10 mg/dL (ref 6–20)
CALCIUM: 9.4 mg/dL (ref 8.7–10.2)
CHLORIDE: 102 mmol/L (ref 96–106)
CO2: 22 mmol/L (ref 20–29)
Creatinine, Ser: 0.69 mg/dL (ref 0.57–1.00)
GFR calc non Af Amer: 116 mL/min/{1.73_m2} (ref >59)
GFR, EST AFRICAN AMERICAN: 133 mL/min/{1.73_m2} (ref >59)
Globulin, Total: 2.4 g/dL (ref 1.5–4.5)
Glucose: 91 mg/dL (ref 65–99)
POTASSIUM: 4.4 mmol/L (ref 3.5–5.2)
Sodium: 138 mmol/L (ref 134–144)
TOTAL PROTEIN: 7.1 g/dL (ref 6.0–8.5)

## 2018-11-17 LAB — LIPID PANEL
CHOL/HDL RATIO: 4.4 ratio (ref 0.0–4.4)
Cholesterol, Total: 201 mg/dL — ABNORMAL HIGH (ref 100–199)
HDL: 46 mg/dL (ref >39)
LDL Calculated: 124 mg/dL — ABNORMAL HIGH (ref 0–99)
Triglycerides: 155 mg/dL — ABNORMAL HIGH (ref 0–149)
VLDL Cholesterol Cal: 31 mg/dL (ref 5–40)

## 2018-11-17 LAB — CERVICOVAGINAL ANCILLARY ONLY
BACTERIAL VAGINITIS: NEGATIVE
CHLAMYDIA, DNA PROBE: NEGATIVE
Candida vaginitis: NEGATIVE
NEISSERIA GONORRHEA: NEGATIVE
Trichomonas: NEGATIVE

## 2018-11-17 LAB — CYTOLOGY - PAP
Diagnosis: NEGATIVE
HPV: NOT DETECTED

## 2018-11-18 ENCOUNTER — Telehealth: Payer: Self-pay

## 2018-11-18 NOTE — Telephone Encounter (Signed)
-----   Message from Hoy Register, MD sent at 11/18/2018  1:31 PM EST ----- PAP smear is negative for malignancy

## 2018-11-18 NOTE — Telephone Encounter (Signed)
Patient was called and informed of lab results. 

## 2018-12-14 ENCOUNTER — Encounter: Payer: Self-pay | Admitting: Family Medicine

## 2019-05-09 ENCOUNTER — Ambulatory Visit: Payer: Self-pay

## 2019-06-26 ENCOUNTER — Ambulatory Visit: Payer: Self-pay | Attending: Family Medicine

## 2019-06-26 ENCOUNTER — Other Ambulatory Visit: Payer: Self-pay

## 2019-06-29 ENCOUNTER — Other Ambulatory Visit: Payer: Self-pay

## 2019-06-29 ENCOUNTER — Ambulatory Visit: Payer: Self-pay | Attending: Family Medicine | Admitting: Pharmacist

## 2019-06-29 DIAGNOSIS — Z23 Encounter for immunization: Secondary | ICD-10-CM

## 2019-06-29 NOTE — Progress Notes (Signed)
Patient presents for vaccination against influenza per orders of Dr. Newlin. Consent given. Counseling provided. No contraindications exists. Vaccine administered without incident.   

## 2019-07-18 ENCOUNTER — Ambulatory Visit: Payer: Self-pay | Attending: Family Medicine | Admitting: Family Medicine

## 2019-07-18 ENCOUNTER — Other Ambulatory Visit: Payer: Self-pay

## 2019-07-18 VITALS — BP 115/73 | HR 70 | Temp 98.2°F | Ht 61.0 in | Wt 140.0 lb

## 2019-07-18 DIAGNOSIS — M25562 Pain in left knee: Secondary | ICD-10-CM

## 2019-07-18 DIAGNOSIS — Z3A01 Less than 8 weeks gestation of pregnancy: Secondary | ICD-10-CM

## 2019-07-18 DIAGNOSIS — R110 Nausea: Secondary | ICD-10-CM

## 2019-07-18 LAB — POCT URINE PREGNANCY: Preg Test, Ur: POSITIVE — AB

## 2019-07-18 NOTE — Progress Notes (Signed)
Patient is having pain in left knee. 

## 2019-07-19 NOTE — Progress Notes (Signed)
Subjective:  Patient ID: Nichole Hughes, female    DOB: 02/20/1986  Age: 33 y.o. MRN: [id number]  CC: Knee Pain   HPI Nichole Hughes is a 33 year old female who presents with nausea, fatigue and had a negative pregnancy test at home.  LMP was sometime last month but she is unsure of the date. She had also complained of left knee pain to the nurse but states this has resolved.  Pain is usually intermittent and occurs behind her left patella for the last 6 months and increases when she is about to sit.  It sometimes hurts to the point that she has difficulty bending her knee. Occasionally uses Tylenol.  Past Medical History:  Diagnosis Date  . Medical history non-contributory     Past Surgical History:  Procedure Laterality Date  . NO PAST SURGERIES      Family History  Problem Relation Age of Onset  . Diabetes Mother     No Known Allergies  Outpatient Medications Prior to Visit  Medication Sig Dispense Refill  . ibuprofen (ADVIL,MOTRIN) 600 MG tablet Take 1 tablet (600 mg total) by mouth every 6 (six) hours. (Patient not taking: Reported on 04/06/2018) 30 tablet 0  . polyethylene glycol (MIRALAX) packet Take 17 g by mouth daily. (Patient not taking: Reported on 04/06/2018) 30 each 0  . Prenatal Vit-Fe Fumarate-FA (PRENATAL VITAMIN PO) Take by mouth.     No facility-administered medications prior to visit.      ROS Review of Systems  Constitutional: Negative for activity change, appetite change and fatigue.  HENT: Negative for congestion, sinus pressure and sore throat.   Eyes: Negative for visual disturbance.  Respiratory: Negative for cough, chest tightness, shortness of breath and wheezing.   Cardiovascular: Negative for chest pain and palpitations.  Gastrointestinal: Positive for nausea. Negative for abdominal distention, abdominal pain and constipation.  Endocrine: Negative for polydipsia.  Genitourinary: Negative for dysuria and frequency.   Musculoskeletal: Negative for arthralgias and back pain.       See hpi  Skin: Negative for rash.  Neurological: Negative for tremors, light-headedness and numbness.  Hematological: Does not bruise/bleed easily.  Psychiatric/Behavioral: Negative for agitation and behavioral problems.    Objective:  BP 115/73   Pulse 70   Temp 98.2 F (36.8 C) (Oral)   Ht 5\' 1"  (1.549 m)   Wt 140 lb (63.5 kg)   SpO2 100%   BMI 26.45 kg/m   BP/Weight 07/18/2019 11/15/2018 04/06/2018  Systolic BP 115 112 108  Diastolic BP 73 76 72  Wt. (Lbs) 140 138.4 130.2  BMI 26.45 26.15 24.6      Physical Exam Constitutional:      Appearance: She is well-developed.  Neck:     Vascular: No JVD.  Cardiovascular:     Rate and Rhythm: Normal rate.     Heart sounds: Normal heart sounds. No murmur.  Pulmonary:     Effort: Pulmonary effort is normal.     Breath sounds: Normal breath sounds. No wheezing or rales.  Chest:     Chest wall: No tenderness.  Abdominal:     General: Bowel sounds are normal. There is no distension.     Palpations: Abdomen is soft. There is no mass.     Tenderness: There is no abdominal tenderness.  Musculoskeletal: Normal range of motion.        General: No tenderness.     Right lower leg: No edema.     Left lower leg: No edema.  Neurological:     Mental Status: She is alert and oriented to person, place, and time.  Psychiatric:        Mood and Affect: Mood normal.     CMP Latest Ref Rng & Units 11/16/2018 05/08/2016 10/31/2015  Glucose 65 - 99 mg/dL 91 82 90  BUN 6 - 20 mg/dL 10 8 12   Creatinine 0.57 - 1.00 mg/dL 0.69 0.65 0.63  Sodium 134 - 144 mmol/L 138 138 137  Potassium 3.5 - 5.2 mmol/L 4.4 4.5 5.0  Chloride 96 - 106 mmol/L 102 105 105  CO2 20 - 29 mmol/L 22 23 23   Calcium 8.7 - 10.2 mg/dL 9.4 9.4 9.2  Total Protein 6.0 - 8.5 g/dL 7.1 - -  Total Bilirubin 0.0 - 1.2 mg/dL 0.5 - -  Alkaline Phos 39 - 117 IU/L 59 - -  AST 0 - 40 IU/L 18 - -  ALT 0 - 32 IU/L 21 - -     Lipid Panel     Component Value Date/Time   CHOL 201 (H) 11/16/2018 0851   TRIG 155 (H) 11/16/2018 0851   HDL 46 11/16/2018 0851   CHOLHDL 4.4 11/16/2018 0851   CHOLHDL 2.8 10/31/2015 0921   VLDL 11 10/31/2015 0921   LDLCALC 124 (H) 11/16/2018 0851    CBC    Component Value Date/Time   WBC 12.7 (H) 04/08/2017 0254   RBC 4.05 04/08/2017 0254   HGB 13.1 04/08/2017 0254   HGB 13.3 10/14/2012 1045   HCT 38.6 04/08/2017 0254   HCT 39.8 10/14/2012 1045   PLT 270 04/08/2017 0254   PLT 275 10/14/2012 1045   MCV 95.3 04/08/2017 0254   MCV 94.6 10/14/2012 1045   MCH 32.3 04/08/2017 0254   MCHC 33.9 04/08/2017 0254   RDW 14.4 04/08/2017 0254   RDW 12.0 10/14/2012 1045   LYMPHSABS 1,924 05/08/2016 1026   LYMPHSABS 2.8 10/14/2012 1045   MONOABS 370 05/08/2016 1026   MONOABS 0.6 10/14/2012 1045   EOSABS 74 05/08/2016 1026   EOSABS 0.1 10/14/2012 1045   BASOSABS 0 05/08/2016 1026   BASOSABS 0.0 10/14/2012 1045    No results found for: HGBA1C  Assessment & Plan:   1. Nausea Secondary to pregnancy - POCT urine pregnancy  2. Less than [redacted] weeks gestation of pregnancy Letter has been provided for her to obtain Medicaid Will refer to OB/GYN  3. Acute pain of left knee Advised to use Tylenol as needed, knee brace    No orders of the defined types were placed in this encounter.   Follow-up: Return follow up with GYN.       Charlott Rakes, MD, FAAFP. [facility] [place], [place]   07/19/2019, 8:00 AM

## 2019-09-04 ENCOUNTER — Ambulatory Visit (INDEPENDENT_AMBULATORY_CARE_PROVIDER_SITE_OTHER): Payer: Self-pay | Admitting: *Deleted

## 2019-09-04 ENCOUNTER — Other Ambulatory Visit: Payer: Self-pay

## 2019-09-04 DIAGNOSIS — Z789 Other specified health status: Secondary | ICD-10-CM | POA: Insufficient documentation

## 2019-09-04 DIAGNOSIS — O219 Vomiting of pregnancy, unspecified: Secondary | ICD-10-CM

## 2019-09-04 DIAGNOSIS — O0990 Supervision of high risk pregnancy, unspecified, unspecified trimester: Secondary | ICD-10-CM | POA: Insufficient documentation

## 2019-09-04 DIAGNOSIS — Z3490 Encounter for supervision of normal pregnancy, unspecified, unspecified trimester: Secondary | ICD-10-CM

## 2019-09-04 MED ORDER — PROMETHAZINE HCL 25 MG PO TABS: 25.0000 mg | ORAL_TABLET | Freq: Four times a day (QID) | ORAL | 0 refills | Status: DC | PRN

## 2019-09-04 MED FILL — ?PROMETHAZINE HCL 25MG TABS: AMNEAL | 7 days supply | Qty: 30 | Fill #0

## 2019-09-04 NOTE — Patient Instructions (Signed)

## 2019-09-04 NOTE — Progress Notes (Signed)
I connected with  Nichole Hughes on 09/04/19 at  9:30 AM EST by telephone and verified that I am speaking with the correct person using two identifiers.   I discussed the limitations, risks, security and privacy concerns of performing an evaluation and management service by telephone and the availability of in person appointments. I also discussed with the patient that there may be a patient responsible charge related to this service. The patient expressed understanding and agreed to proceed. Explained I am completing her New OB Intake today. We discussed [name] is undetermined. based on unsure LMP - she states she had a period in September ,maybe around 05/31/19 but not sure- could have been earlier or later that month, but did not have a period in October or November. I explained I will schedule a dating [place] to determine how far along she is and I gave her that appointment and explained she will have [place] first and then come to our office for results.   I reviewed her allergies, meds, OB History, Medical /Surgical history, and appropriate screenings. For covid screening we discussed vomiiting could be from early pregnancy but the severe headache  Is worrisome - could be from not eating well due to nausea and vomiting- explained if she has any other symptoms of covid- such as SOB, cough, fever, loss of taste or smell she will need to be tested for covid and she should call  us and we will give her instructions. She states headache is relieved by one extra strength tylenol. She denies history of blood pressure issues. She does report occasional headaches before pregnancy if in sun a lot, etc. I explained now that she is pregnant if she has a severe headache unrelieved by tylenol she needs to go to hospital to get bp checked as it could be her blood pressure.  I also sent in RX for phenergan and gave her instructions on usage- try 1/2 tablet first due to sleepiness.   I explained we will give her  a  blood pressure cuff when she comes to her first ob visit since she is self pay- she states she has letter re: financial assistance- I asked her to bring with her to her first ob visit. Explained  then we will have her take her blood pressure weekly  Explained she will have some visits in office and some virtually. I sent her a text to sign up for  MyChart and attempted to  Assist her with signing up - we were unable to do this and I explained we will  Help her hen she comes to the office for her ob visit.  Reviewed appointment date/ time with her , our location and to wear mask, no visitors. Explained she will have exam, ob bloodwork, hemoglobin a1C, cbg , genetic testing if desired,  She voices understanding.   Nichole Karnes,RN 09/04/2019  9:31 AM

## 2019-09-04 NOTE — Progress Notes (Signed)
Patient seen and assessed by nursing staff.  Agree with documentation and plan.  

## 2019-09-11 ENCOUNTER — Other Ambulatory Visit: Payer: Self-pay

## 2019-09-11 ENCOUNTER — Ambulatory Visit (INDEPENDENT_AMBULATORY_CARE_PROVIDER_SITE_OTHER): Payer: Self-pay | Admitting: Family Medicine

## 2019-09-11 ENCOUNTER — Encounter: Payer: Self-pay | Admitting: Family Medicine

## 2019-09-11 VITALS — BP 124/79 | HR 96 | Wt 136.4 lb

## 2019-09-11 DIAGNOSIS — Z113 Encounter for screening for infections with a predominantly sexual mode of transmission: Secondary | ICD-10-CM

## 2019-09-11 DIAGNOSIS — Z789 Other specified health status: Secondary | ICD-10-CM

## 2019-09-11 DIAGNOSIS — Z3490 Encounter for supervision of normal pregnancy, unspecified, unspecified trimester: Secondary | ICD-10-CM

## 2019-09-11 DIAGNOSIS — Z3492 Encounter for supervision of normal pregnancy, unspecified, second trimester: Secondary | ICD-10-CM

## 2019-09-11 DIAGNOSIS — Z3A17 17 weeks gestation of pregnancy: Secondary | ICD-10-CM

## 2019-09-11 NOTE — Progress Notes (Signed)
Subjective:   Nichole Hughes is a 33 y.o. G2P1001 at Unknown by LMP being seen today for her first obstetrical visit.  Her obstetrical history is not significant.. Patient does intend to breast feed. Pregnancy history fully reviewed.  Patient reports no complaints.  HISTORY: OB History  Gravida Para Term Preterm AB Living  2 1 1  0 0 1  SAB TAB Ectopic Multiple Live Births  0 0 0 0 1    # Outcome Date GA Lbr Len/2nd Weight Sex Delivery Anes PTL Lv  2 Current           1 Term 04/08/17 [redacted]w[redacted]d 30:02 / 00:37 7 lb 5.5 oz (3.33 kg) M Vag-Spont EPI  LIV     Name: Velazquez,BOY Keiana     Apgar1: 9  Apgar5: 9   Last pap smear was  11/2018 and was normal Past Medical History:  Diagnosis Date  . Headache   . Medical history non-contributory    Past Surgical History:  Procedure Laterality Date  . NO PAST SURGERIES     Family History  Problem Relation Age of Onset  . Diabetes Mother   . Healthy Father    Social History   Tobacco Use  . Smoking status: Never Smoker  . Smokeless tobacco: Never Used  Substance Use Topics  . Alcohol use: Not Currently    Comment: occasionally on weekends  . Drug use: No   No Known Allergies Current Outpatient Medications on File Prior to Visit  Medication Sig Dispense Refill  . Prenatal Vit-Fe Fumarate-FA (PRENATAL VITAMIN PO) Take by mouth.    Marland Kitchen acetaminophen (TYLENOL) 500 MG tablet Take 500 mg by mouth every 6 (six) hours as needed.    . promethazine (PHENERGAN) 25 MG tablet Take 1 tablet (25 mg total) by mouth every 6 (six) hours as needed for nausea or vomiting. May take 1/2 tablet if preferred (Patient not taking: Reported on 09/11/2019) 30 tablet 0   No current facility-administered medications on file prior to visit.     Exam   Vitals:   09/11/19 1012  BP: 124/79  Pulse: 96  Weight: 136 lb 6.4 oz (61.9 kg)   Fetal Heart Rate (bpm): 154  Uterus:   14 wk size  System: General: well-developed, well-nourished  female in no acute distress   Skin: normal coloration and turgor, no rashes   Neurologic: oriented, normal, negative, normal mood   Extremities: normal strength, tone, and muscle mass, ROM of all joints is normal   HEENT Extraocular movement intact and sclera clear, anicteric   Mouth/Teeth mucous membranes moist, pharynx normal without lesions and dental hygiene good   Neck supple and no masses   Cardiovascular: regular rate and rhythm   Respiratory:  no respiratory distress, normal breath sounds   Abdomen: soft, non-tender; bowel sounds normal; no masses,  no organomegaly     Assessment:   Pregnancy: G2P1001 Patient Active Problem List   Diagnosis Date Noted  . Language barrier 09/04/2019  . Supervision of low-risk pregnancy 09/04/2019  . Preventative health care 06/26/2013     Plan:  1. Encounter for supervision of low-risk pregnancy, antepartum Declines NIPT  - Culture, OB Urine - Obstetric Panel, Including HIV - GC/Chlamydia probe amp ([place])not at [facility] - Hemoglobin A1c - [place] MFM OB COMP + 14 WK; Future  2. Language barrier Live Spanish interpreter: Donne Hazel used    Initial labs drawn. Continue prenatal vitamins. Genetic Screening discussed, NIPS: declined. Ultrasound discussed; fetal  anatomic survey: ordered. Problem list reviewed and updated. The nature of [place] [facility] with multiple MDs and other Advanced Practice Providers was explained to patient; also emphasized that residents, students are part of our team. Routine obstetric precautions reviewed. Return in 4 weeks (on 10/09/2019) for [facility], virtual, needs U/S in 4 wks with AFP during her anatomy scan.

## 2019-09-11 NOTE — Patient Instructions (Signed)
° °Lactancia materna °Breastfeeding ° °Decidir amamantar es una de las mejores elecciones que puede hacer por usted y su bebé. Un cambio en las hormonas durante el embarazo hace que las mamas produzcan leche materna en las glándulas productoras de leche. Las hormonas impiden que la leche materna sea liberada antes del nacimiento del bebé. Además, impulsan el flujo de leche luego del nacimiento. Una vez que ha comenzado a amamantar, pensar en el bebé, así como la succión o el llanto, pueden estimular la liberación de leche de las glándulas productoras de leche. °Los beneficios de amamantar °Las investigaciones demuestran que la lactancia materna ofrece muchos beneficios de salud para bebés y madres. Además, ofrece una forma gratuita y conveniente de alimentar al bebé. °Para el bebé °· La primera leche (calostro) ayuda a mejorar el funcionamiento del aparato digestivo del bebé. °· Las células especiales de la leche (anticuerpos) ayudan a combatir las infecciones en el bebé. °· Los bebés que se alimentan con leche materna también tienen menos probabilidades de tener asma, alergias, obesidad o diabetes de tipo 2. Además, tienen menor riesgo de sufrir el síndrome de muerte súbita del lactante (SMSL). °· Los nutrientes de la leche materna son mejores para satisfacer las necesidades del bebé en comparación con la leche maternizada. °· La leche materna mejora el desarrollo cerebral del bebé. °Para usted °· La lactancia materna favorece el desarrollo de un vínculo muy especial entre la madre y el bebé. °· Es conveniente. La leche materna es económica y siempre está disponible a la temperatura correcta. °· La lactancia materna ayuda a quemar calorías. Le ayuda a perder el peso ganado durante el embarazo. °· Hace que el útero vuelva al tamaño que tenía antes del embarazo más rápido. Además, disminuye el sangrado (loquios) después del parto. °· La lactancia materna contribuye a reducir el riesgo de tener diabetes de tipo 2,  osteoporosis, artritis reumatoide, enfermedades cardiovasculares y cáncer de mama, ovario, útero y endometrio en el futuro. °Información básica sobre la lactancia °Comienzo de la lactancia °· Encuentre un lugar cómodo para sentarse o acostarse, con un buen respaldo para el cuello y la espalda. °· Coloque una almohada o una manta enrollada debajo del bebé para acomodarlo a la altura de la mama (si está sentada). Las almohadas para amamantar se han diseñado especialmente a fin de servir de apoyo para los brazos y el bebé mientras amamanta. °· Asegúrese de que la barriga del bebé (abdomen) esté frente a la suya. °· Masajee suavemente la mama. Con las yemas de los dedos, masajee los bordes exteriores de la mama hacia adentro, en dirección al pezón. Esto estimula el flujo de leche. Si la leche fluye lentamente, es posible que deba continuar con este movimiento durante la lactancia. °· Sostenga la mama con 4 dedos por debajo y el pulgar por arriba del pezón (forme la letra “C” con la mano). Asegúrese de que los dedos se encuentren lejos del pezón y de la boca del bebé. °· Empuje suavemente los labios del bebé con el pezón o con el dedo. °· Cuando la boca del bebé se abra lo suficiente, acérquelo rápidamente a la mama e introduzca todo el pezón y la aréola, tanto como sea posible, dentro de la boca del bebé. La aréola es la zona de color que rodea al pezón. °? Debe haber más aréola visible por arriba del labio superior del bebé que por debajo del labio inferior. °? Los labios del bebé deben estar abiertos y extendidos hacia afuera (evertidos) para asegurar   que el bebé se prenda de forma adecuada y cómoda. °? La lengua del bebé debe estar entre la encía inferior y la mama. °· Asegúrese de que la boca del bebé esté en la posición correcta alrededor del pezón (prendido). Los labios del bebé deben crear un sello sobre la mama y estar doblados hacia afuera (invertidos). °· Es común que el bebé succione durante 2 a 3 minutos  para que comience el flujo de leche materna. °Cómo debe prenderse °Es muy importante que le enseñe al bebé cómo prenderse adecuadamente a la mama. Si el bebé no se prende adecuadamente, puede causar dolor en los pezones, reducir la producción de leche materna y hacer que el bebé tenga un escaso aumento de peso. Además, si el bebé no se prende adecuadamente al pezón, puede tragar aire durante la alimentación. Esto puede causarle molestias al bebé. Hacer eructar al bebé al cambiar de mama puede ayudarlo a liberar el aire. Sin embargo, enseñarle al bebé cómo prenderse a la mama adecuadamente es la mejor manera de evitar que se sienta molesto por tragar aire mientras se alimenta. °Signos de que el bebé se ha prendido adecuadamente al pezón °· Tironea o succiona de modo silencioso, sin causarle dolor. Los labios del bebé deben estar extendidos hacia afuera (evertidos). °· Se escucha que traga cada 3 o 4 succiones una vez que la leche ha comenzado a fluir (después de que se produzca el reflejo de eyección de la leche). °· Hay movimientos musculares por arriba y por delante de sus oídos al succionar. °Signos de que el bebé no se ha prendido adecuadamente al pezón °· Hace ruidos de succión o de chasquido mientras se alimenta. °· Siente dolor en los pezones. °Si cree que el bebé no se prendió correctamente, deslice el dedo en la comisura de la boca y colóquelo entre las encías del bebé para interrumpir la succión. Intente volver a comenzar a amamantar. °Signos de lactancia materna exitosa °Signos del bebé °· El bebé disminuirá gradualmente el número de succiones o dejará de succionar por completo. °· El bebé se quedará dormido. °· El cuerpo del bebé se relajará. °· El bebé retendrá una pequeña cantidad de leche en la boca. °· El bebé se desprenderá solo del pecho. °Signos que presenta usted °· Las mamas han aumentado la firmeza, el peso y el tamaño 1 a 3 horas después de amamantar. °· Están más blandas inmediatamente después  de amamantar. °· Se producen un aumento del volumen de leche y un cambio en su consistencia y color hacia el quinto día de lactancia. °· Los pezones no duelen, no están agrietados ni sangran. °Signos de que su bebé recibe la cantidad de leche suficiente °· Mojar por lo menos 1 o 2 pañales durante las primeras 24 horas después del nacimiento. °· Mojar por lo menos 5 o 6 pañales cada 24 horas durante la primera semana después del nacimiento. La orina debe ser clara o de color amarillo pálido a los 5 días de vida. °· Mojar entre 6 y 8 pañales cada 24 horas a medida que el bebé sigue creciendo y desarrollándose. °· Defeca por lo menos 3 veces en 24 horas a los 5 días de vida. Las heces deben ser blandas y amarillentas. °· Defeca por lo menos 3 veces en 24 horas a los 7 días de vida. Las heces deben ser grumosas y amarillentas. °· No registra una pérdida de peso mayor al 10 % del peso al nacer durante los primeros 3 días de vida. °· Aumenta de peso un promedio de 4   a 7 onzas (113 a 198 g) por semana después de los 4 días de vida. °· Aumenta de peso, diariamente, de manera uniforme a partir de los 5 días de vida, sin registrar pérdida de peso después de las 2 semanas de vida. °Después de alimentarse, es posible que el bebé regurgite una pequeña cantidad de leche. Esto es normal. °Frecuencia y duración de la lactancia °El amamantamiento frecuente la ayudará a producir más leche y puede prevenir dolores en los pezones y las mamas extremadamente llenas (congestión mamaria). Alimente al bebé cuando muestre signos de hambre o si siente la necesidad de reducir la congestión de las mamas. Esto se denomina "lactancia a demanda". Las señales de que el bebé tiene hambre incluyen las siguientes: °· Aumento del estado de alerta, actividad o inquietud. °· Mueve la cabeza de un lado a otro. °· Abre la boca cuando se le toca la mejilla o la comisura de la boca (reflejo de búsqueda). °· Aumenta las vocalizaciones, tales como sonidos de  succión, se relame los labios, emite arrullos, suspiros o chirridos. °· Mueve la mano hacia la boca y se chupa los dedos o las manos. °· Está molesto o llora. °Evite el uso del chupete en las primeras 4 a 6 semanas después del nacimiento del bebé. Después de este período, podrá usar un chupete. Las investigaciones demostraron que el uso del chupete durante el primer año de vida del bebé disminuye el riesgo de tener el síndrome de muerte súbita del lactante (SMSL). °Permita que el niño se alimente en cada mama todo lo que desee. Cuando el bebé se desprende o se queda dormido mientras se está alimentando de la primera mama, ofrézcale la segunda. Debido a que, con frecuencia, los recién nacidos están somnolientos las primeras semanas de vida, es posible que deba despertar al bebé para alimentarlo. °Los horarios de lactancia varían de un bebé a otro. Sin embargo, las siguientes reglas pueden servir como guía para ayudarla a garantizar que el bebé se alimenta adecuadamente: °· Se puede amamantar a los recién nacidos (bebés de 4 semanas o menos de vida) cada 1 a 3 horas. °· No deben transcurrir más de 3 horas durante el día o 5 horas durante la noche sin que se amamante a los recién nacidos. °· Debe amamantar al bebé un mínimo de 8 veces en un período de 24 horas. °Extracción de leche materna ° °  ° °La extracción y el almacenamiento de la leche materna le permiten asegurarse de que el bebé se alimente exclusivamente de su leche materna, aun en momentos en los que no puede amamantar. Esto tiene especial importancia si debe regresar al trabajo en el período en que aún está amamantando o si no puede estar presente en los momentos en que el bebé debe alimentarse. Su asesor en lactancia puede ayudarla a encontrar un método de extracción que funcione mejor para usted y orientarla sobre cuánto tiempo es seguro almacenar leche materna. °Cómo cuidar las mamas durante la lactancia °Los pezones pueden secarse, agrietarse y doler  durante la lactancia. Las siguientes recomendaciones pueden ayudarla a mantener las mamas humectadas y sanas: °· Evite usar jabón en los pezones. °· Use un sostén de soporte diseñado especialmente para la lactancia materna. Evite usar sostenes con aro o sostenes muy ajustados (sostenes deportivos). °· Seque al aire sus pezones durante 3 a 4 minutos después de amamantar al bebé. °· Utilice solo apósitos de algodón en el sostén para absorber las pérdidas de leche. La pérdida de un poco de leche materna entre   las tomas es normal. °· Utilice lanolina sobre los pezones luego de amamantar. La lanolina ayuda a mantener la humedad normal de la piel. La lanolina pura no es perjudicial (no es tóxica) para el bebé. Además, puede extraer manualmente algunas gotas de leche materna y masajear suavemente esa leche sobre los pezones para que la leche se seque al aire. °Durante las primeras semanas después del nacimiento, algunas mujeres experimentan congestión mamaria. La congestión mamaria puede hacer que sienta las mamas pesadas, calientes y sensibles al tacto. El pico de la congestión mamaria ocurre en el plazo de los 3 a 5 días después del parto. Las siguientes recomendaciones pueden ayudarla a aliviar la congestión mamaria: °· Vacíe por completo las mamas al amamantar o extraer leche. Puede aplicar calor húmedo en las mamas (en la ducha o con toallas húmedas para manos) antes de amamantar o extraer leche. Esto aumenta la circulación y ayuda a que la leche fluya. Si el bebé no vacía por completo las mamas cuando lo amamanta, extraiga la leche restante después de que haya finalizado. °· Aplique compresas de hielo sobre las mamas inmediatamente después de amamantar o extraer leche, a menos que le resulte demasiado incómodo. Haga lo siguiente: °? Ponga el hielo en una bolsa plástica. °? Coloque una toalla entre la piel y la bolsa de hielo. °? Coloque el hielo durante 20 minutos, 2 o 3 veces por día. °· Asegúrese de que el bebé  esté prendido y se encuentre en la posición correcta mientras lo alimenta. °Si la congestión mamaria persiste luego de 48 horas o después de seguir estas recomendaciones, comuníquese con su médico o un asesor en lactancia. °Recomendaciones de salud general durante la lactancia °· Consuma 3 comidas y 3 colaciones saludables todos los días. Las madres bien alimentadas que amamantan necesitan entre 450 y 500 calorías adicionales por día. Puede cumplir con este requisito al aumentar la cantidad de una dieta equilibrada que realice. °· Beba suficiente agua para mantener la orina clara o de color amarillo pálido. °· Descanse con frecuencia, relájese y siga tomando sus vitaminas prenatales para prevenir la fatiga, el estrés y los niveles bajos de vitaminas y minerales en el cuerpo (deficiencias de nutrientes). °· No consuma ningún producto que contenga nicotina o tabaco, como cigarrillos y cigarrillos electrónicos. El bebé puede verse afectado por las sustancias químicas de los cigarrillos que pasan a la leche materna y por la exposición al humo ambiental del tabaco. Si necesita ayuda para dejar de fumar, consulte al médico. °· Evite el consumo de alcohol. °· No consuma drogas ilegales o marihuana. °· Antes de usar cualquier medicamento, hable con el médico. Estos incluyen medicamentos recetados y de venta libre, como también vitaminas y suplementos a base de hierbas. Algunos medicamentos, que pueden ser perjudiciales para el bebé, pueden pasar a través de la leche materna. °· Puede quedar embarazada durante la lactancia. Si se desea un método anticonceptivo, consulte al médico sobre cuáles son las opciones seguras durante la lactancia. °Dónde encontrar más información: °Liga internacional [place]: www.llli.org. °Comuníquese con un médico si: °· Siente que quiere dejar de amamantar o se siente frustrada con la lactancia. °· Sus pezones están agrietados o sangran. °· Sus mamas están irritadas, sensibles o  calientes. °· Tiene los siguientes síntomas: °? Dolor en las mamas o en los pezones. °? Un área hinchada en cualquiera de las mamas. °? Fiebre o escalofríos. °? Náuseas o vómitos. °? Drenaje de otro líquido distinto de la leche materna desde los pezones. °· Sus mamas no   se llenan antes de amamantar al bebé para el quinto día después del parto. °· Se siente triste y deprimida. °· El bebé: °? Está demasiado somnoliento como para comer bien. °? Tiene problemas para dormir. °? Tiene más de 1 semana de vida y moja menos de 6 pañales en un periodo de 24 horas. °? No ha aumentado de peso a los 5 días de vida. °· El bebé defeca menos de 3 veces en 24 horas. °· La piel del bebé o las partes blancas de los ojos se vuelven amarillentas. °Solicite ayuda de inmediato si: °· El bebé está muy cansado (letargo) y no se quiere despertar para comer. °· Le sube la fiebre sin causa. °Resumen °· La lactancia materna ofrece muchos beneficios de salud para bebés y madres. °· Intente amamantar a su bebé cuando muestre signos tempranos de hambre. °· Haga cosquillas o empuje suavemente los labios del bebé con el dedo o el pezón para lograr que el bebé abra la boca. Acerque el bebé a la mama. Asegúrese de que la mayor parte de la aréola se encuentre dentro de la boca del bebé. Ofrézcale una mama y haga eructar al bebé antes de pasar a la otra. °· Hable con su médico o asesor en lactancia si tiene dudas o problemas con la lactancia. °Esta información no tiene como fin reemplazar el consejo del médico. Asegúrese de hacerle al médico cualquier pregunta que tenga. °Document Released: 08/31/2005 Document Revised: 11/25/2017 Document Reviewed: 12/21/2016 °Elsevier Patient Education © 2020 [employer] ° °

## 2019-09-11 NOTE — Progress Notes (Signed)
Spanish Interpreter [employer].  New OB packet given  Genetic Screening

## 2019-09-12 ENCOUNTER — Ambulatory Visit: Payer: Self-pay

## 2019-09-12 ENCOUNTER — Other Ambulatory Visit (HOSPITAL_COMMUNITY): Payer: Self-pay | Admitting: *Deleted

## 2019-09-12 ENCOUNTER — Ambulatory Visit (HOSPITAL_BASED_OUTPATIENT_CLINIC_OR_DEPARTMENT_OTHER)
Admission: RE | Admit: 2019-09-12 | Discharge: 2019-09-12 | Disposition: A | Discharge status: Home / Self Care | Payer: Self-pay | Source: Ambulatory Visit | Attending: Obstetrics and Gynecology | Admitting: Obstetrics and Gynecology

## 2019-09-12 ENCOUNTER — Other Ambulatory Visit (HOSPITAL_COMMUNITY): Payer: Self-pay | Admitting: Obstetrics and Gynecology

## 2019-09-12 ENCOUNTER — Ambulatory Visit (HOSPITAL_COMMUNITY)
Admission: RE | Admit: 2019-09-12 | Discharge: 2019-09-12 | Disposition: A | Discharge status: Home / Self Care | Payer: Self-pay | Source: Ambulatory Visit | Attending: Family Medicine | Admitting: Family Medicine

## 2019-09-12 DIAGNOSIS — O359XX0 Maternal care for (suspected) fetal abnormality and damage, unspecified, not applicable or unspecified: Secondary | ICD-10-CM

## 2019-09-12 DIAGNOSIS — O283 Abnormal ultrasonic finding on antenatal screening of mother: Secondary | ICD-10-CM

## 2019-09-12 DIAGNOSIS — Z3490 Encounter for supervision of normal pregnancy, unspecified, unspecified trimester: Secondary | ICD-10-CM | POA: Insufficient documentation

## 2019-09-12 DIAGNOSIS — Z3492 Encounter for supervision of normal pregnancy, unspecified, second trimester: Secondary | ICD-10-CM

## 2019-09-12 DIAGNOSIS — Z3A17 17 weeks gestation of pregnancy: Secondary | ICD-10-CM

## 2019-09-12 LAB — OBSTETRIC PANEL, INCLUDING HIV
Antibody Screen: NEGATIVE
Basophils Absolute: 0 10*3/uL (ref 0.0–0.2)
Basos: 0 %
EOS (ABSOLUTE): 0 10*3/uL (ref 0.0–0.4)
Eos: 0 %
HIV Screen 4th Generation wRfx: NONREACTIVE
Hematocrit: 37.9 % (ref 34.0–46.6)
Hemoglobin: 13.1 g/dL (ref 11.1–15.9)
Hepatitis B Surface Ag: NEGATIVE
Immature Grans (Abs): 0 10*3/uL (ref 0.0–0.1)
Immature Granulocytes: 0 %
Lymphocytes Absolute: 1.9 10*3/uL (ref 0.7–3.1)
Lymphs: 18 %
MCH: 32.2 pg (ref 26.6–33.0)
MCHC: 34.6 g/dL (ref 31.5–35.7)
MCV: 93 fL (ref 79–97)
Monocytes Absolute: 0.5 10*3/uL (ref 0.1–0.9)
Monocytes: 5 %
Neutrophils Absolute: 7.7 10*3/uL — ABNORMAL HIGH (ref 1.4–7.0)
Neutrophils: 77 %
Platelets: 308 10*3/uL (ref 150–450)
RBC: 4.07 x10E6/uL (ref 3.77–5.28)
RDW: 12.2 % (ref 11.7–15.4)
RPR Ser Ql: NONREACTIVE
Rh Factor: POSITIVE
Rubella Antibodies, IGG: 7.3 index (ref >0.99)
WBC: 10.2 10*3/uL (ref 3.4–10.8)

## 2019-09-12 LAB — HEMOGLOBIN A1C
Est. average glucose Bld gHb Est-mCnc: 108 mg/dL
Hgb A1c MFr Bld: 5.4 % (ref 4.8–5.6)

## 2019-09-13 LAB — URINE CULTURE, OB REFLEX: Organism ID, Bacteria: NO GROWTH

## 2019-09-13 LAB — CULTURE, OB URINE

## 2019-09-13 LAB — GC/CHLAMYDIA PROBE AMP (~~LOC~~) NOT AT ARMC
Chlamydia: NEGATIVE
Comment: NEGATIVE
Comment: NORMAL
Neisseria Gonorrhea: NEGATIVE

## 2019-09-15 NOTE — L&D Delivery Note (Signed)
OB/[facility]N Faculty [facility] Delivery Note  Nichole Hughes is a 34 y.o. G2P1001 s/p VD at [redacted]w[redacted]d. She was admitted for SOL.   ROM: 2h 35m with clear fluid GBS Status: Negative/-- (05/03 1625) Maximum Maternal Temperature: 98.82F  Labor Progress: . Initial SVE: 5/90/0. Received epidural. AROM performed at 7 cm. She then progressed to complete.   Delivery Date/Time: 5/27 @ 2250 Delivery: Called to room and patient was complete and pushing. Head delivered LOA. No nuchal cord present. Shoulder and body delivered in usual fashion. Infant with spontaneous cry, placed on mother's abdomen, dried and stimulated. Cord clamped x 2 after 1-minute delay, and cut by FOB. Cord blood drawn. Placenta delivered spontaneously with gentle cord traction. Fundus firm with massage and Pitocin. Labia, perineum, vagina, and cervix inspected inspected with no lacerations.  Baby Weight: pending  Placenta: Sent to L&D Complications: None Lacerations: None EBL: 258 mL Analgesia: Epidural   Infant:  APGAR (1 MIN): 9  APGAR (5 MINS): 9  APGAR (10 MINS):     Nichole Birkenhead, MD OB Family Medicine Fellow, [facility] for [facility], [facility] 02/08/2020, 11:13 PM

## 2019-09-25 ENCOUNTER — Telehealth: Payer: Self-pay | Admitting: Obstetrics and Gynecology

## 2019-09-25 NOTE — Telephone Encounter (Signed)
Received a call from Nichole Hughes stating she had been around someone that had tested positive for COVID. She stated her test was negative. I informed her that her next appointment was on 01/27, and if she has any symptoms call the office.

## 2019-10-05 ENCOUNTER — Other Ambulatory Visit: Payer: Self-pay | Admitting: Lactation Services

## 2019-10-05 DIAGNOSIS — Z3490 Encounter for supervision of normal pregnancy, unspecified, unspecified trimester: Secondary | ICD-10-CM

## 2019-10-11 ENCOUNTER — Other Ambulatory Visit: Payer: Self-pay

## 2019-10-11 ENCOUNTER — Encounter (HOSPITAL_COMMUNITY): Payer: Self-pay

## 2019-10-11 ENCOUNTER — Ambulatory Visit (HOSPITAL_COMMUNITY): Payer: Self-pay | Admitting: *Deleted

## 2019-10-11 ENCOUNTER — Ambulatory Visit (HOSPITAL_COMMUNITY)
Admission: RE | Admit: 2019-10-11 | Discharge: 2019-10-11 | Disposition: A | Discharge status: Home / Self Care | Payer: Self-pay | Source: Ambulatory Visit | Attending: Family Medicine | Admitting: Family Medicine

## 2019-10-11 ENCOUNTER — Ambulatory Visit (HOSPITAL_COMMUNITY): Payer: Self-pay

## 2019-10-11 VITALS — BP 104/65 | HR 75 | Temp 97.8°F

## 2019-10-11 DIAGNOSIS — Z3A21 21 weeks gestation of pregnancy: Secondary | ICD-10-CM

## 2019-10-11 DIAGNOSIS — O0990 Supervision of high risk pregnancy, unspecified, unspecified trimester: Secondary | ICD-10-CM

## 2019-10-11 DIAGNOSIS — Z362 Encounter for other antenatal screening follow-up: Secondary | ICD-10-CM

## 2019-10-11 DIAGNOSIS — O359XX0 Maternal care for (suspected) fetal abnormality and damage, unspecified, not applicable or unspecified: Secondary | ICD-10-CM

## 2019-10-11 DIAGNOSIS — Z3490 Encounter for supervision of normal pregnancy, unspecified, unspecified trimester: Secondary | ICD-10-CM

## 2019-10-11 DIAGNOSIS — O283 Abnormal ultrasonic finding on antenatal screening of mother: Secondary | ICD-10-CM | POA: Insufficient documentation

## 2019-10-12 ENCOUNTER — Telehealth (INDEPENDENT_AMBULATORY_CARE_PROVIDER_SITE_OTHER): Payer: Self-pay | Admitting: Obstetrics and Gynecology

## 2019-10-12 VITALS — BP 109/70 | HR 86 | Wt 144.0 lb

## 2019-10-12 DIAGNOSIS — Z3A21 21 weeks gestation of pregnancy: Secondary | ICD-10-CM

## 2019-10-12 DIAGNOSIS — Z3492 Encounter for supervision of normal pregnancy, unspecified, second trimester: Secondary | ICD-10-CM

## 2019-10-12 DIAGNOSIS — Z789 Other specified health status: Secondary | ICD-10-CM

## 2019-10-12 DIAGNOSIS — Z3490 Encounter for supervision of normal pregnancy, unspecified, unspecified trimester: Secondary | ICD-10-CM

## 2019-10-12 NOTE — Progress Notes (Signed)
TELEHEALTH OBSTETRICS PRENATAL VIRTUAL VIDEO VISIT ENCOUNTER NOTE  Provider location: [facility] [employer] at [place]   I connected with Juanda Bond on 10/12/19 at  2:15 PM EST by WebEx Video Encounter at home and verified that I am speaking with the correct person using two identifiers.   I discussed the limitations, risks, security and privacy concerns of performing an evaluation and management service virtually and the availability of in person appointments. I also discussed with the patient that there may be a patient responsible charge related to this service. The patient expressed understanding and agreed to proceed. Subjective:  Brynlei Klausner is a 34 y.o. G2P1001 at [redacted]w[redacted]d being seen today for ongoing prenatal care.  She is currently monitored for the following issues for this low-risk pregnancy and has Preventative health care; Language barrier; and Supervision of low-risk pregnancy on their problem list.  Patient reports no complaints.  Contractions: Not present. Vag. Bleeding: None.  Movement: Present. Denies any leaking of fluid.   The following portions of the patient's history were reviewed and updated as appropriate: allergies, current medications, past family history, past medical history, past social history, past surgical history and problem list.   Objective:   Vitals:   10/12/19 1402  BP: 109/70  Pulse: 86  Weight: 144 lb (65.3 kg)    Fetal Status:     Movement: Present     General:  Alert, oriented and cooperative. Patient is in no acute distress.  Respiratory: Normal respiratory effort, no problems with respiration noted  Mental Status: Normal mood and affect. Normal behavior. Normal judgment and thought content.  Rest of physical exam deferred due to type of encounter  Imaging: [place] MFM OB DETAIL +14 WK  Result Date: 09/12/2019 ----------------------------------------------------------------------  OBSTETRICS REPORT                        (Signed Final 09/12/2019 04:58 pm) ---------------------------------------------------------------------- Patient Info  ID #:       [id number]                          D.O.B.:  [birth year] (34 yrs)  Name:       Kathryne Gin                     Visit Date: 09/12/2019 03:46 pm              Tu ---------------------------------------------------------------------- Performed By  Performed By:     Lenise Arena        Ref. Address:     [address]                    [place]                                                             [address]  Attending:        Noralee Space MD        [facility]  Referred By:      [facility] ---------------------------------------------------------------------- Orders   #  Description                          Code         Ordered By   1  [place] MFM OB DETAIL +14 WK              76734.19     Noralee Space  ----------------------------------------------------------------------   #  Order #                    Accession #                 Episode #   1  [id number]                  [id number]                  [id number]  ---------------------------------------------------------------------- Indications   Encounter for antenatal screening for          Z36.3   malformations   Fetal abnormality - other known or             O35.9XX0   suspected (CPC)   [redacted] weeks gestation of pregnancy                Z3A.17  ---------------------------------------------------------------------- Vital Signs  Weight (lb): 136                               Height:        5'1"  BMI:         25.69 ---------------------------------------------------------------------- Fetal Evaluation  Num Of Fetuses:         1  Fetal Heart Rate(bpm):  142  Cardiac Activity:       Observed  Presentation:           Cephalic  Placenta:               Posterior  P. Cord Insertion:      Visualized, central  Amniotic Fluid  AFI FV:       Within normal limits                              Largest Pocket(cm)                              4.04 ---------------------------------------------------------------------- Biometry  BPD:      38.2  mm     G. Age:  17w 5d         67  %    CI:        81.46   %    70 - 86                                                          FL/HC:      16.9   %    14.6 - 17.6  HC:      133.6  mm     G. Age:  16w 6d  21  %    HC/AC:      1.13        1.07 - 1.29  AC:      117.8  mm     G. Age:  17w 4d         57  %    FL/BPD:     59.2   %  FL:       22.6  mm     G. Age:  16w 5d         26  %    FL/AC:      19.2   %    20 - 24  CER:        16  mm     G. Age:  16w 0d         21  %  NFT:       3.2  mm  Est. FW:     183  gm      0 lb 6 oz     35  % ---------------------------------------------------------------------- OB History  Gravidity:    2         Term:   1  Living:       1 ---------------------------------------------------------------------- Gestational Age  U/S Today:     17w 2d                                        EDD:   02/18/20  Best:          17w 2d     Det. By:  U/S (09/12/19)           EDD:   02/18/20 ---------------------------------------------------------------------- Anatomy  Cranium:               Appears normal         Aortic Arch:            Appears normal  Cavum:                 Not well visualized    Ductal Arch:            Appears normal  Ventricles:            Not well visualized    Diaphragm:              Appears normal  Choroid Plexus:        Appears normal         Stomach:                Appears normal, left                                                                        sided  Cerebellum:            Appears normal         Abdomen:                Appears normal  Posterior Fossa:       Not well visualized    Abdominal Wall:         Appears  nml (cord                                                                        insert, abd wall)  Nuchal Fold:           Not well visualized    Cord  Vessels:           Appears normal (3                                                                        vessel cord)  Face:                  Not well visualized    Kidneys:                Appear normal  Lips:                  Not well visualized    Bladder:                Appears normal  Thoracic:              Appears normal         Spine:                  Appears normal  Heart:                 Not well visualized    Upper Extremities:      Appears normal  RVOT:                  Appears normal         Lower Extremities:      Appears normal  LVOT:                  Appears normal  Other:  Female gender. Technically difficult due to early gestational age. ---------------------------------------------------------------------- Cervix Uterus Adnexa  Cervix  Length:           3.46  cm.  Normal appearance by transabdominal scan.  Uterus  No abnormality visualized. ---------------------------------------------------------------------- Impression  Patient is here for pregnancy dating.  Obstetric history  significant for previous term vaginal delivery.  She is unsure  of her LMP date.  On ultrasound fetal biometry is consistent with 17 weeks 2  days gestation.  Amniotic fluid is normal and good fetal  activity seen.  No markers of aneuploidies or fetal structural  defects are seen.  Fetal anatomical survey is somewhat  limited because of early gestational age.  I explained the findings with the help of Spanish language  interpreter present in the room.  Patient does not want  screening for fetal aneuploidies.  We have assigned [name] at 02/18/2020. ---------------------------------------------------------------------- Recommendations  -An appointment was made for her to return in 4 weeks for  completion of fetal anatomy. ----------------------------------------------------------------------  Noralee Space, MD Electronically Signed Final Report   09/12/2019 04:58 pm  ----------------------------------------------------------------------  [place] MFM OB FOLLOW UP  Result Date: 10/11/2019 ----------------------------------------------------------------------  OBSTETRICS REPORT                       (Signed Final 10/11/2019 03:40 pm) ---------------------------------------------------------------------- Patient Info  ID #:       [id number]                          D.O.B.:  21-Mar-1986 (33 yrs)  Name:       Kathryne Gin                     Visit Date: 10/11/2019 10:50 am              [place] ---------------------------------------------------------------------- Performed By  Performed By:     Earley Brooke     Ref. Address:     [address]                    [place], [address]                                                             [address]  Attending:        Lin Landsman      [facility]  Referred By:      [facility] ---------------------------------------------------------------------- Orders   #  Description                          Code         Ordered By   1  [place] MFM OB FOLLOW UP                  40981.19     Noralee Space  ----------------------------------------------------------------------   #  Order #                    Accession #                 Episode #   1  [id number]                  [id number]                  [id number]  ---------------------------------------------------------------------- Indications   Encounter for antenatal screening for          Z36.3   malformations   [redacted] weeks gestation of pregnancy                Z3A.21   Fetal abnormality - other known or             O35.9XX0   suspected (CPC)  ---------------------------------------------------------------------- Vital Signs  Height:        5'1" ---------------------------------------------------------------------- Fetal Evaluation  Num Of Fetuses:         1   Fetal Heart Rate(bpm):  162  Cardiac Activity:       Observed  Presentation:           Cephalic  Placenta:               Posterior  P. Cord Insertion:      Previously seen as normal  Amniotic Fluid  AFI FV:      Within normal limits                              Largest Pocket(cm)                              5.47 ---------------------------------------------------------------------- Biometry  BPD:      49.8  mm     G. Age:  21w 1d         34  %    CI:         75.6   %    70 - 86                                                          FL/HC:      20.0   %    15.9 - 20.3  HC:      181.6  mm     G. Age:  20w 4d         10  %    HC/AC:      1.10        1.06 - 1.25  AC:      165.2  mm     G. Age:  21w 4d         47  %    FL/BPD:     72.9   %  FL:       36.3  mm     G. Age:  21w 4d         44  %    FL/AC:      22.0   %    20 - 24  HUM:      34.5  mm     G. Age:  21w 6d         57  %  CER:      22.4  mm     G. Age:  21w 1d         42  %  LV:        6.3  mm  CM:        6.6  mm  Est. FW:     423  gm    0 lb 15 oz      44  % ---------------------------------------------------------------------- OB History  Gravidity:    2         Term:   1  Living:       1 ---------------------------------------------------------------------- Gestational Age  U/S Today:     21w 2d  EDD:   02/19/20  Best:          [place] 3d     Det. By:  U/S  (09/12/19)          EDD:   02/18/20 ---------------------------------------------------------------------- Anatomy  Cranium:               Previously seen        LVOT:                   Previously seen  Cavum:                 Appears normal         Aortic Arch:            Previously seen  Ventricles:            Appears normal         Ductal Arch:            Previously seen  Choroid Plexus:        Resolved CPC           Diaphragm:              Appears normal  Cerebellum:            Previously seen        Stomach:                Appears normal, left                                                                         sided  Posterior Fossa:       Appears normal         Abdomen:                Appears normal  Nuchal Fold:           Not applicable (>20    Abdominal Wall:         Previously seen                         wks GA)  Face:                  Appears normal         Cord Vessels:           Previously seen                         (orbits and profile)  Lips:                  Appears normal         Kidneys:                Appear normal  Palate:                Not well visualized    Bladder:                Appears normal  Thoracic:              Appears normal         Spine:  Previously seen  Heart:                 Appears normal         Upper Extremities:      Previously seen                         (4CH, axis, and                         situs)  RVOT:                  Previously seen        Lower Extremities:      Previously seen  Other:  Female gender previously seen. Heels visualized. ---------------------------------------------------------------------- Cervix Uterus Adnexa  Cervix  Normal appearance by transabdominal scan. ---------------------------------------------------------------------- Impression  Normal interval growth  Anatomy complete  Good fetal movement and amniotic fluid observed.  Prior CP cyst seen, these were not observed during today's  examination. ---------------------------------------------------------------------- Recommendations  Follow up growth as clinically indicated. ----------------------------------------------------------------------               Sander Nephew, MD Electronically Signed Final Report   10/11/2019 03:40 pm ----------------------------------------------------------------------   Assessment and Plan:  Pregnancy: G2P1001 at [redacted]w[redacted]d 1. Encounter for supervision of low-risk pregnancy, antepartum   Interpretor  Discussed EDC in detail. Discussed change in EDD based on MFM [place].  BP good today.   Preterm labor  symptoms and general obstetric precautions including but not limited to vaginal bleeding, contractions, leaking of fluid and fetal movement were reviewed in detail with the patient. I discussed the assessment and treatment plan with the patient. The patient was provided an opportunity to ask questions and all were answered. The patient agreed with the plan and demonstrated an understanding of the instructions. The patient was advised to call back or seek an in-person office evaluation/go to [facility] at [facility] for any urgent or concerning symptoms. Please refer to After Visit Summary for other counseling recommendations.   I provided 14 minutes of face-to-face time during this encounter.  Return in about 4 weeks (around 11/09/2019) for Virtual visit is ok, spanish speaking. .  No future appointments.  Noni Saupe, NP [facility], [place]

## 2019-10-12 NOTE — Progress Notes (Signed)
[id number] [name] Spanish Interpreter I connected with  Nichole Hughes on 10/12/19 at  2:15 PM EST by telephone and verified that I am speaking with the correct person using two identifiers.   I discussed the limitations, risks, security and privacy concerns of performing an evaluation and management service by telephone and the availability of in person appointments. I also discussed with the patient that there may be a patient responsible charge related to this service. The patient expressed understanding and agreed to proceed.  [name] Fred Franzen, CMA 10/12/2019  2:01 PM

## 2019-10-13 ENCOUNTER — Telehealth: Payer: Self-pay | Admitting: Obstetrics & Gynecology

## 2019-10-13 LAB — AFP, SERUM, OPEN SPINA BIFIDA
AFP MoM: 1.68
AFP Value: 119.5 ng/mL
Gest. Age on Collection Date: 21.3 weeks
Maternal Age At EDD: 33.5 yr
OSBR Risk 1 IN: 1713
Test Results:: NEGATIVE
Weight: 139 [lb_av]

## 2019-11-09 ENCOUNTER — Telehealth (INDEPENDENT_AMBULATORY_CARE_PROVIDER_SITE_OTHER): Payer: Self-pay | Admitting: Advanced Practice Midwife

## 2019-11-09 ENCOUNTER — Encounter: Payer: Self-pay | Admitting: Advanced Practice Midwife

## 2019-11-09 ENCOUNTER — Other Ambulatory Visit: Payer: Self-pay

## 2019-11-09 DIAGNOSIS — Z3490 Encounter for supervision of normal pregnancy, unspecified, unspecified trimester: Secondary | ICD-10-CM

## 2019-11-09 DIAGNOSIS — Z3A25 25 weeks gestation of pregnancy: Secondary | ICD-10-CM

## 2019-11-09 DIAGNOSIS — Z3492 Encounter for supervision of normal pregnancy, unspecified, second trimester: Secondary | ICD-10-CM

## 2019-11-09 NOTE — Progress Notes (Signed)
I connected with  Nichole Hughes on 11/09/19 at  1:55 PM EST by telephone and verified that I am speaking with the correct person using two identifiers.   I discussed the limitations, risks, security and privacy concerns of performing an evaluation and management service by telephone and the availability of in person appointments. I also discussed with the patient that there may be a patient responsible charge related to this service. The patient expressed understanding and agreed to proceed.  [name] Coley Littles, CMA 11/09/2019  2:01 PM

## 2019-11-09 NOTE — Progress Notes (Signed)
   TELEHEALTH VIRTUAL OBSTETRICS VISIT ENCOUNTER NOTE  I connected with Nichole Hughes on 11/09/19 at  1:55 PM EST by telephone at home and verified that I am speaking with the correct person using two identifiers.   I discussed the limitations, risks, security and privacy concerns of performing an evaluation and management service by telephone and the availability of in person appointments. I also discussed with the patient that there may be a patient responsible charge related to this service. The patient expressed understanding and agreed to proceed.  Subjective:  Nichole Hughes is a 34 y.o. G2P1001 at 101w4d being followed for ongoing prenatal care.  She is currently monitored for the following issues for this low-risk pregnancy and has Preventative health care; Language barrier; and Supervision of low-risk pregnancy on their problem list.  Patient reports no complaints. Reports fetal movement. Denies any contractions, bleeding or leaking of fluid.   The following portions of the patient's history were reviewed and updated as appropriate: allergies, current medications, past family history, past medical history, past social history, past surgical history and problem list.   Objective:   General:  Alert, oriented and cooperative.   Mental Status: Normal mood and affect perceived. Normal judgment and thought content.  Rest of physical exam deferred due to type of encounter  Assessment and Plan:  Pregnancy: G2P1001 at [redacted]w[redacted]d 1. Encounter for supervision of low-risk pregnancy, antepartum - routine care - GTT at next visit  - Patient is having upper abdominal pain that is worse after eating. She states that the pain is better when she moves around and changes position.  - Eda was here as interpretor for the visit   Preterm labor symptoms and general obstetric precautions including but not limited to vaginal bleeding, contractions, leaking of fluid and fetal movement  were reviewed in detail with the patient.  I discussed the assessment and treatment plan with the patient. The patient was provided an opportunity to ask questions and all were answered. The patient agreed with the plan and demonstrated an understanding of the instructions. The patient was advised to call back or seek an in-person office evaluation/go to [facility] at [facility] for any urgent or concerning symptoms. Please refer to After Visit Summary for other counseling recommendations.   I provided 12 minutes of non-face-to-face time during this encounter.  Return in about 4 weeks (around 12/07/2019) for in person visit for 28 week labs and GTT .  No future appointments.  Thressa Sheller DNP, CNM  11/09/19  2:07 PM  [facility] [employer], [facility]

## 2019-12-11 ENCOUNTER — Other Ambulatory Visit: Payer: Self-pay

## 2019-12-11 ENCOUNTER — Ambulatory Visit (INDEPENDENT_AMBULATORY_CARE_PROVIDER_SITE_OTHER): Payer: Self-pay | Admitting: Nurse Practitioner

## 2019-12-11 ENCOUNTER — Other Ambulatory Visit: Payer: Self-pay | Admitting: General Practice

## 2019-12-11 VITALS — BP 103/67 | HR 86 | Wt 138.9 lb

## 2019-12-11 DIAGNOSIS — Z3493 Encounter for supervision of normal pregnancy, unspecified, third trimester: Secondary | ICD-10-CM

## 2019-12-11 DIAGNOSIS — Z789 Other specified health status: Secondary | ICD-10-CM

## 2019-12-11 DIAGNOSIS — Z3A30 30 weeks gestation of pregnancy: Secondary | ICD-10-CM

## 2019-12-11 DIAGNOSIS — Z603 Acculturation difficulty: Secondary | ICD-10-CM

## 2019-12-11 DIAGNOSIS — Z23 Encounter for immunization: Secondary | ICD-10-CM

## 2019-12-11 NOTE — Progress Notes (Signed)
    Subjective:  Nichole Hughes is a 34 y.o. G2P1001 at [redacted]w[redacted]d being seen today for ongoing prenatal care.  She is currently monitored for the following issues for this low-risk pregnancy and has Preventative health care; Language barrier; and Supervision of low-risk pregnancy on their problem list.  Patient reports backache.  Contractions: Irritability. Vag. Bleeding: None.  Movement: Present. Denies leaking of fluid.   The following portions of the patient's history were reviewed and updated as appropriate: allergies, current medications, past family history, past medical history, past social history, past surgical history and problem list. Problem list updated.  Objective:   Vitals:   12/11/19 0836  BP: 103/67  Pulse: 86  Weight: 138 lb 14.4 oz (63 kg)    Fetal Status: Fetal Heart Rate (bpm): 139 Fundal Height: 28 cm Movement: Present     General:  Alert, oriented and cooperative. Patient is in no acute distress.  Skin: Skin is warm and dry. No rash noted.   Cardiovascular: Normal heart rate noted  Respiratory: Normal respiratory effort, no problems with respiration noted  Abdomen: Soft, gravid, appropriate for gestational age. Pain/Pressure: Present     Pelvic:  Cervical exam deferred        Extremities: Normal range of motion.  Edema: Trace  Mental Status: Normal mood and affect. Normal behavior. Normal judgment and thought content.   Urinalysis:      Assessment and Plan:  Pregnancy: G2P1001 at [redacted]w[redacted]d  1. Encounter for supervision of low-risk pregnancy in third trimester To call about vasectomy services at the [facility]. Pain after eating - will try tums as pain is on her right side under her ribs - not episgatric pain backpain - reviewed stretching exercises she can use for low back pain  - Glucose Tolerance, 2 Hours w/1 Hour - CBC - HIV Antibody (routine testing w rflx) - RPR - Tdap vaccine greater than or equal to 7yo IM  2. Language barrier In person  interpreter present for the entire visit.  Preterm labor symptoms and general obstetric precautions including but not limited to vaginal bleeding, contractions, leaking of fluid and fetal movement were reviewed in detail with the patient. Please refer to After Visit Summary for other counseling recommendations.  Return in about 2 weeks (around 12/25/2019) for in person ROB - check fundal height.  Nolene Bernheim, RN, MSN, NP-BC Nurse Practitioner, [facility] for [employer], [facility] 12/11/2019 9:22 AM

## 2019-12-11 NOTE — Progress Notes (Signed)
Pt states lower back pain & also pain in upper right side under arm area.

## 2019-12-11 NOTE — Patient Instructions (Addendum)
Tums for pain after eating.  Can take Gas X Take Tylenol 325 mg 2 tablets by mouth every 4 hours if needed for pain. Drink at least 8 8-oz glasses of water every day.     AREA PEDIATRIC/FAMILY PRACTICE PHYSICIANS  Central/[place]outheast [place] ([id number]) . [facility] Melodie Bouillon, MD; Lum Babe, MD; Sheffield Slider, MD; Leveda Anna, MD; McDiarmid, MD; Jerene Bears, MD; Jennette Kettle, MD; Gwendolyn Grant, MD o [address] [place] [place], [place] [address] o [phone] o Mon-Fri 8:30-12:30, 1:30-5:00 o Providers come to see babies at [facility] o Accepting Medicaid . [facility] [place] o Limited providers who accept newborns: Docia Chuck, MD; Kateri Plummer, MD; Paulino Rily, MD o [address], [place], [place] [address] o [phone] o Mon-Fri 8:00-5:30 o Babies seen by providers at [facility] o Does NOT accept Medicaid o Please call early in hospitalization for appointment (limited availability)  . [facility] Fatima Sanger, MD o [address], [place], [place] [address] o [phone] o Mon, Tue, Thur, Fri 8:30-5:00, Wed 10:00-7:00 (closed 1-2pm) o Babies seen by [facility] providers o Accepting Medicaid . Donnie Coffin - Pediatrician Fae Pippin, MD o [address], [place], [place] [address] o [phone] o Mon-Fri 8:30-5:00, Sat 8:30-12:00 o Provider comes to see babies at [facility] o Accepting Medicaid o Must have been referred from current patients or contacted office prior to delivery . [facility]) Leotis Pain, MD; Ave Filter, MD; Luna Fuse, MD; Kennedy Bucker, MD; Konrad Dolores, MD; Kathlene November, MD; Jenne Campus, MD; Lubertha South, MD; Wynetta Emery, MD; Duffy Rhody, MD; Gerre Couch, NP; Shirl Harris, NP o [address] [place], [place], [place] [address] o [phone] o Mon, Tue, Thur, Fri 8:30-5:30, Wed 9:30-5:30, Sat 8:30-12:30 o Babies seen by [facility] providers o Accepting  Medicaid o Only accepting infants of first-time parents or siblings of current patients [facility] discharge coordinator will make follow-up appointment . Cyril Mourning o [address] [place], [place]  [address] o [phone]   Fax - [phone] . [facility] o [address], [place], [place]  [address] o Phone - [phone]   Fax - [phone] . Lucio Edward o [address], [place], [place]  [address] o [phone]  East/Northeast  [phone]) . [facility] Jorge Mandril, MD; Alita Chyle, MD; Princella Ion, MD; MD; Earlene Plater, MD; Jamesetta Orleans, MD; Alvera Novel, MD; Clarene Duke, MD; Rana Snare, MD; Carmon Ginsberg, MD; Alinda Money, MD; Hosie Poisson, MD; Mayford Knife, MD o [address] [place], [place] [address] o [phone] o Mon-Fri 8:30-5:00 (extended evenings Mon-Thur as needed), Sat-Sun 10:00-1:00 o Providers come to see babies at [facility] o Accepting Medicaid for families of first-time babies and families with all children in the household age 69 and under. Must register with office prior to making appointment (M-F only). [facility] Odella Aquas, NP; Lynelle Doctor, MD; Susann Givens, MD; [place], [place] o [address] [place], [place] [address] o [phone] o Mon-Fri 8:00-5:00 o Babies seen by providers at [facility] o Does NOT accept Medicaid/Commercial Insurance Only . [facility] [place] [facility])  Suzette Battiest, MD; Zachery Dauer, MD; Stefan Church, MD; Sabino Dick, MD; Quitman Livings, MD; Farris Has, MD; Gaynell Face, MD; Betha Loa, MD; Colon Flattery, MD; Clifton James, MD o [address] [place] [place], [place] [address] o [phone] o Mon-Fri 8:30-5:30, Sat (Oct.-Mar.) 9:00-1:00 o Babies seen by providers at [facility] o Accepting [facility] [phone]) . [facility],  MD; Sheliah Hatch, MD o [address], [place], [place] [address] o [phone] o Mon-Fri 8:30-5:00, Sat 8:30-12:00 o Providers come  to see babies at [facility] o Does NOT accept Medicaid . [facility] Cindy Hazy, [place]; [place], MD; [place], [place]; Wynelle Link, MD; Azucena Cecil, MD o [address] [place], [place] [address] o [phone] o Mon-Fri 8:00-5:00 o Babies seen by providers at [facility] o Does NOT accept Medicaid o Only accepting babies of parents who are patients o Please call early in hospitalization for appointment (limited availability) . [facility] Lamar Benes, MD; Abran Cantor, MD; Early Osmond, MD; Cherre Huger, NP; Hyacinth Meeker, MD; Dwan Bolt, MD; Jarold Motto, NP; Dario Guardian, MD; Talmage Nap, MD; Maisie Fus, MD; Pricilla Holm, MD; Tama High, MD o [address], [place], [place] [address] o [phone] o Mon-Fri 8:00-5:00, Sat 9:00-12:00 o Providers come to see babies at [facility] o Does NOT accept [facility] [phone]) . [facility] at [facility] o Limited providers accepting new patients: Drema Pry, NP; [place], PA o [address], [place], [place] [address] o [phone] o Mon-Fri 8:00-5:00 o Babies seen by providers at [facility] o Does NOT accept Medicaid o Only accepting babies of parents who are patients o Please call early in hospitalization for appointment (limited availability) . [facility] Luan Pulling, MD; Nash Dimmer, MD o [address] [place] [place], [place] [address] o [phone] (press 1 to schedule appointment) o Mon-Fri 8:00-5:00 o Providers come to see babies at [facility] o Does NOT accept Medicaid . [facility] Cristino Martes, MD o [address], [place], [place] [address] o [phone] o Mon-Fri 8:30-5:00 (lunch 12:30-1:00), extended hours by appointment only Wed 5:00-6:30 o Babies seen by [facility] providers o Accepting Medicaid . [facility] Abbot, MD; [place], MD; Hassan Rowan, MD o [address] [place], [place], [place] [address] o [phone] o Mon-Fri 8:00-5:00 o Babies seen by [facility] providers o Does NOT accept Medicaid . Nature conservation officer at [place] [address] Elsworth Soho, MD; Durene Cal, MD; Bland[place], DO o [address] [place], [place] [address] o [phone] o Mon-Fri 8:00-5:00 o Babies seen by [facility] providers o Does NOT accept Medicaid . [facility] o [place], [place]; [place], [place]; [place], NP; Avis Epley, MD; Vonna Kotyk, MD; Clance Boll, MD; Stevphen Rochester, NP; Arvilla Market, NP; Ann Maki, NP; Otis Dials, NP; Vaughan Basta, MD; [place], MD o [address] [place], [place] [address] o [phone] o Mon-Fri 8:30-5:00, Sat 10:00-1:00 o Providers come to see babies at [facility] o Does NOT accept Medicaid o Free prenatal information session Tuesdays at 4:45pm . [facility] Luna Kitchens, MD; [place], [place]; [place], [place]; [place], [place] o [address] [place] [address] o [phone] o Mon-Fri 7:30-5:30 o Babies seen by [facility] providers . [facility] Children's Doctor o [address], [place], [place]  [address] o [phone]   Fax - [phone]  [place] [phone] & [phone]) . [facility] Alphonsa Overall, MD o [address] [place], [place] [address] o [phone] o Mon-Thur 8:00-6:00 o Providers come to see babies at [facility] o Accepting Medicaid . [facility] Zenon Mayo, NP; Cyndia Bent, MD; [place], [place]; [place], [place] o [address] [place], [place] [address] o [phone] o Mon-Thur 7:30-7:30, Fri 7:30-4:30 o Babies seen by [facility] providers o Accepting Medicaid . [facility] Cheryle Horsfall, MD; Janene Harvey, NP; Vonita Moss, MD o [address], [place], [place] [address] o [phone] o Mon-Fri  8:30-5:00, Sat 8:30-12:00 o Providers come to see babies at [place] Medicaid o Must have "Meet & Greet" appointment at office prior to delivery . [place]) Jodene Nam, MD;  Juleen China, MD; Clydene Laming, [place] [address], [place], [place] [address] o [phone] o Mon-Wed 8:00-6:00, Thur-Fri 8:00-5:00, Sat 9:00-12:00 o Providers come to see babies at [facility] o Does NOT accept Medicaid o Only accepting siblings of current patients . [facility] [place], [place], [place], [place]  [address] o [phone]   Fax [phone] . [place] [facility] [place] [address] [place], [place]  [address] o [phone]   Fax - [place] [phone] & [phone]) . Therapist, music at [place], DO; [place], [address] [place], [place] [address] o [phone] o Mon-Fri 7:00-5:00 o Babies seen by [facility] providers o Does NOT accept Medicaid . [place], MD; [place], [place]; [place], [place], [place], [place] [address] o [phone] o Mon-Fri 8:00-5:00 o Babies seen by [facility] providers o Accepting Medicaid . [place], MD; [place], [place]; [place], NP; [place], [place], [place], [place] [address] o [phone] o Mon-Fri 8:00-5:00 o Babies seen by providers at [place] [phone]) . [facility] [place], [place] o [address] [place], [place] [address] o [phone] o Mon-Fri 8:00-5:00 o Babies seen by [facility] providers o Does NOT accept Medicaid o Limited availability, please call early in hospitalization to schedule follow-up . [facility] Leilani Merl, PA; Maisie Fus, MD; [place], MD; [place], [place]; Jeannine Kitten, MD; [place], [place] [facility] [address], [place], [place] [address] o [phone] o Mon-Fri 8:30-5:00, Sat 9:00-12:00 o Babies seen by providers at [facility] o Accepting  Medicaid o Please register online then schedule online or call office o [email or web address] . [place] [facility] [facility] [place]) Kristian Covey, NP; Dwyane Dee, MD; Leonidas Romberg, PA o [address] [place], [place], [place] [address] o [phone] o Mon-Fri 8:00-5:00 o Babies seen by providers at [facility] o Accepting Medicaid . [place] [facility] [employer]) Dairl Ponder, MD; Rayvon Char, NP; Melina Modena, MD o [address] [place], [place], [place] [address] o [phone] o Mon-Fri 8:00-5:30, Sat&Sun by appointment (phones open at 8:30) o Babies seen by [facility] providers o Accepting Medicaid o Must be a first-time baby or sibling of current patient . [place], [address], [place], [place]  [address] o [phone]   Fax - [phone]  [place] [phone] & [phone]) . [place], [place]; [place], [place]; Benjamine Mola, MD; [place], [place]; Harrell Lark, MD o [address], [place], [place] [address] o [phone] o Mon-Thur 8:00-7:00, Fri 8:00-5:00, Sat 8:00-12:00, Sun 9:00-12:00 o Babies seen by [facility] providers o Accepting Medicaid . [facility] at [facility], MD; Ruthann Cancer, MD; [facility], MD o [address], [place],  [address] o [phone] o Mon-Thur 8:00-5:00 o Babies seen by providers at [facility] o Accepting Medicaid . [facility]  [facility] at [facility], MD; Coe-Goins, MD; Madilyn Fireman, MD; Melvyn Neth, MD; [place], MD; Lazarus Salines, MD; Gaynell Face, MD; Berneda Rose, MD; Flora Lipps, MD; Beryl Meager, MD; Luther Redo, MD; Lavonia Drafts, MD; Kellie Simmering, MD o [address] [place] [place], [place] [address] o [phone] o Mon-Fri 8:00-5:30, Sat (Oct.-Mar.) 9:00-1:00 o Babies seen by providers at [facility] o Accepting Medicaid o Must fill out new patient packet, available  online at [email or web address] . [facility] [place] [facility] at [facility]) Simone Curia, NP; Tiburcio Pea, NP; Tresa Endo, NP; Whitney Post, MD; [place], [place]; Hennie Duos, MD; Wynne Dust, MD; Kavin Leech, NP o [address], [place], [place] [address] o [phone] o Mon-Thur 8:00-5:30, Fri 8:00-5:00 o Babies seen by providers at [facility] o Accepting [facility] [phone]) . [facility] o [place], [place]; [place], MD; Tanya Nones, MD; [place], [place] o [address] [place], [place] [address] o [phone] o Mon-Fri 8:00-5:00 o Babies seen by providers at [facility] o Accepting [facility] [phone]) . [facility] at [facility] o [place], DO; Lenise Arena, MD; [place], [place] o [address], [place], [place] [address] o [phone] o Mon-Fri 8:00-5:00 o Babies seen by providers at [facility] o Does NOT accept Medicaid o Limited appointment availability, please call early in hospitalization  . Nature conservation officer at [facility] o [place], DO; [place], MD o [address] [place], [place] [address] o [phone] o Mon-Fri 8:00-5:00 o Babies seen by [facility] providers o Does NOT accept Medicaid . [facility] Lorrine Kin, MD; Ninetta Lights, MD; [place], [place]; [place], MD o [address], [place], [place] [address] o [phone] o Mon-Fri 8:00-5:00 o After hours clinic [facility] [place], [place] [address]) [phone] Mon-Fri 5:00-8:00, Sat 12:00-6:00, Sun 10:00-4:00 o Babies seen by [facility] providers o Accepting Medicaid . [facility] at [facility] o [address], [place], [place]  [address] o [phone]   Fax - [phone]  [place] [phone]) . Nature conservation officer at [facility], MD o [address] [place], [place], [place] [address] o [phone] o Mon-Fri 8:00-5:00 o Babies seen by [facility] providers o Does NOT  accept Medicaid . [facility] - [place] [facility] at [place]) Tomi Likens, MD o [address] [place] [address] [place], [place] [address] o [phone] o Mon-Thur 8:00-7:00, Fri 8:00-5:00, Sat 8:00-12:00 o Babies seen by providers at [facility] o Accepting Medicaid - but does not have vaccinations in office (must be received elsewhere) o Limited availability, please call early in hospitalization  [place] ([id number]) . [facility]  o Wyvonne Lenz, MD o [address] [place] [address] o [phone]  Fax [phone]

## 2019-12-12 ENCOUNTER — Other Ambulatory Visit: Payer: Self-pay | Admitting: Nurse Practitioner

## 2019-12-12 ENCOUNTER — Encounter: Payer: Self-pay | Admitting: Nurse Practitioner

## 2019-12-12 DIAGNOSIS — O24420 Gestational diabetes mellitus in childbirth, diet controlled: Secondary | ICD-10-CM | POA: Insufficient documentation

## 2019-12-12 LAB — RPR: RPR Ser Ql: NONREACTIVE

## 2019-12-12 LAB — HIV ANTIBODY (ROUTINE TESTING W REFLEX): HIV Screen 4th Generation wRfx: NONREACTIVE

## 2019-12-12 LAB — CBC
Hematocrit: 34.2 % (ref 34.0–46.6)
Hemoglobin: 11.5 g/dL (ref 11.1–15.9)
MCH: 31.7 pg (ref 26.6–33.0)
MCHC: 33.6 g/dL (ref 31.5–35.7)
MCV: 94 fL (ref 79–97)
Platelets: 275 10*3/uL (ref 150–450)
RBC: 3.63 x10E6/uL — ABNORMAL LOW (ref 3.77–5.28)
RDW: 13 % (ref 11.7–15.4)
WBC: 10.2 10*3/uL (ref 3.4–10.8)

## 2019-12-12 LAB — GLUCOSE TOLERANCE, 2 HOURS W/ 1HR
Glucose, 1 hour: 192 mg/dL — ABNORMAL HIGH (ref 65–179)
Glucose, 2 hour: 140 mg/dL (ref 65–152)
Glucose, Fasting: 87 mg/dL (ref 65–91)

## 2019-12-13 ENCOUNTER — Encounter: Payer: Self-pay | Admitting: *Deleted

## 2019-12-13 ENCOUNTER — Telehealth: Payer: Self-pay | Admitting: *Deleted

## 2019-12-13 NOTE — Telephone Encounter (Addendum)
-----   Message from Currie Paris, NP sent at 12/12/2019  7:15 PM EDT ----- Call client - speaks Spanish.  Failed 2 hr glucola.  Gestations diabetes.  Refer to nutritionist.  3/31 1020  Called pt w/interpreter Eda Royal. VM was full and unable to leave message. Per chart review, pt does check MyChart messages in timely fashion. Therefore pt will be sent MyChart message regarding results and an appointment will be scheduled for Diabetes education. Pt will be sent a MyChart message of appointment details.

## 2019-12-21 ENCOUNTER — Other Ambulatory Visit: Payer: Self-pay

## 2019-12-21 ENCOUNTER — Ambulatory Visit: Payer: Self-pay | Admitting: Registered"

## 2019-12-21 ENCOUNTER — Encounter: Payer: Self-pay | Attending: Nurse Practitioner | Admitting: Registered"

## 2019-12-21 DIAGNOSIS — Z3A00 Weeks of gestation of pregnancy not specified: Secondary | ICD-10-CM | POA: Insufficient documentation

## 2019-12-21 DIAGNOSIS — O24419 Gestational diabetes mellitus in pregnancy, unspecified control: Secondary | ICD-10-CM | POA: Insufficient documentation

## 2019-12-21 DIAGNOSIS — Z713 Dietary counseling and surveillance: Secondary | ICD-10-CM | POA: Insufficient documentation

## 2019-12-21 NOTE — Progress Notes (Signed)
Interpreter services provided via [employer] video from [place] [phone] video froze. Visit completed with [facility] via phone Clifton James [phone].  Patient was seen on 12/21/19 for Gestational Diabetes self-management. EDD 02/18/20. Patient states no history of GDM. Diet history obtained. Patient had difficulty answering questions about what she eats, states she does not know what to eat. When interpreter pressed her for information patient states she is only drinking water, eats oatmeal with fruit, snacks on some fruits, chicken, cheese cucumber tortilla. Beverages include water and milk.     When RD said that she would need to prick her finger, patient immediately started crying and sweating; has intense fear of needles. Patient states someone in her home can test her blood sugar for her FBS and after dinner. Patient was okay learning how to set up meter and lancing device. RD pricked patient's finger as patient looked away but patient was visibly shaken by this but not as much as the thought of doing it herself.   We will likely only be able to get 2 CBG per day.  The following learning objectives were met by the patient :   States why dietary management is important in controlling blood glucose  Describes the effects of carbohydrates on blood glucose levels  Demonstrates ability to create a balanced meal plan  Demonstrates carbohydrate counting   States when to check blood glucose levels  Verbalizes proper blood glucose monitoring techniques   Plan:  Aim for 3 Carbohydrate Choices per meal (45 grams) +/- 1 either way  Aim for 1-2 Carbohydrate Choices per snack Begin reading food labels for Total Carbohydrate of foods If OK with your MD, increase your activity level daily as tolerated Begin checking Blood Glucose before breakfast and 2 hours after first bite of breakfast, lunch and dinner as directed by MD  Bring Log Book/Sheet and meter to every medical appointment  Take medication if  directed by MD  Blood glucose monitor given: Prodigy Blood glucose reading: 104 mg/dL  Patient instructed to monitor glucose levels: FBS: 60 - 95 mg/dl 2 hour: <120 mg/dl  Patient received the following handouts:  Nutrition Diabetes and Pregnancy  Carbohydrate Counting List  Blood glucose Log Sheet  Patient will be seen for follow-up in as needed.

## 2019-12-26 ENCOUNTER — Encounter: Payer: Self-pay | Admitting: Obstetrics & Gynecology

## 2019-12-26 ENCOUNTER — Telehealth (INDEPENDENT_AMBULATORY_CARE_PROVIDER_SITE_OTHER): Payer: Self-pay | Admitting: Obstetrics & Gynecology

## 2019-12-26 VITALS — BP 98/77 | HR 74

## 2019-12-26 DIAGNOSIS — Z603 Acculturation difficulty: Secondary | ICD-10-CM

## 2019-12-26 DIAGNOSIS — O24420 Gestational diabetes mellitus in childbirth, diet controlled: Secondary | ICD-10-CM

## 2019-12-26 DIAGNOSIS — Z3A32 32 weeks gestation of pregnancy: Secondary | ICD-10-CM

## 2019-12-26 DIAGNOSIS — O24410 Gestational diabetes mellitus in pregnancy, diet controlled: Secondary | ICD-10-CM

## 2019-12-26 DIAGNOSIS — O0993 Supervision of high risk pregnancy, unspecified, third trimester: Secondary | ICD-10-CM

## 2019-12-26 NOTE — Patient Instructions (Signed)
Regrese a la clinica cuando tenga su cita. Si tiene problemas o preguntas, llama a la clinica o vaya a la sala de emergencia al [facility].    

## 2019-12-26 NOTE — Progress Notes (Signed)
I connected with  Kathryne Gin Pavlicek on 12/26/19 at  8:35 AM EDT by telephone and verified that I am speaking with the correct person using two identifiers.   I discussed the limitations, risks, security and privacy concerns of performing an evaluation and management service by telephone and the availability of in person appointments. I also discussed with the patient that there may be a patient responsible charge related to this service. The patient expressed understanding and agreed to proceed.  Marylynn Pearson, RN 12/26/2019  8:41 AM

## 2019-12-26 NOTE — Progress Notes (Signed)
   OBSTETRICS PRENATAL VIRTUAL VISIT ENCOUNTER NOTE  Provider location: Center for [facility] at [place]   I connected with Nichole Hughes on 12/26/19 at  8:35 AM EDT by MyChart Video Encounter at home and verified that I am speaking with the correct person using two identifiers.   I discussed the limitations, risks, security and privacy concerns of performing an evaluation and management service virtually and the availability of in person appointments. I also discussed with the patient that there may be a patient responsible charge related to this service. The patient expressed understanding and agreed to proceed. Subjective:  Nichole Hughes is a 34 y.o. G2P1001 at [redacted]w[redacted]d being seen today for ongoing prenatal care.  She is currently monitored for the following issues for this high-risk pregnancy and has Language barrier; Supervision of high-risk pregnancy; and Gestational diabetes mellitus (GDM) in childbirth, diet controlled on their problem list.  Patient reports no complaints.  Contractions: Irritability. Vag. Bleeding: None.  Movement: Present. Denies any leaking of fluid.   The following portions of the patient's history were reviewed and updated as appropriate: allergies, current medications, past family history, past medical history, past social history, past surgical history and problem list.   Objective:   Vitals:   12/26/19 0844  BP: 98/77  Pulse: 74    Fetal Status:     Movement: Present     General:  Alert, oriented and cooperative. Patient is in no acute distress.  Respiratory: Normal respiratory effort, no problems with respiration noted  Mental Status: Normal mood and affect. Normal behavior. Normal judgment and thought content.  Rest of physical exam deferred due to type of encounter  Imaging: No results found.  Assessment and Plan:  Pregnancy: G2P1001 at [redacted]w[redacted]d 1. Gestational diabetes mellitus (GDM) in childbirth, diet  controlled Reviewed CBGs with patient from her paper log.  Only checks twice a day due to fear of needles, someone else helps her prick her finger before breakfast and after dinner.  These values are within range. Continue diet and exercise control.  2. Supervision of high risk pregnancy in third trimester Preterm labor symptoms and general obstetric precautions including but not limited to vaginal bleeding, contractions, leaking of fluid and fetal movement were reviewed in detail with the patient. I discussed the assessment and treatment plan with the patient. The patient was provided an opportunity to ask questions and all were answered. The patient agreed with the plan and demonstrated an understanding of the instructions. The patient was advised to call back or seek an in-person office evaluation/go to [facility] at [facility] for any urgent or concerning symptoms. Please refer to After Visit Summary for other counseling recommendations.   I provided 7 minutes of face-to-face time during this encounter.  Return in about 2 weeks (around 01/09/2020) for OFFICE HOB Visit (SPANISH).  Future Appointments  Date Time Provider Department Center  01/09/2020  9:30 AM [facility] FINANCIAL COUNSELOR [facility] None    Jaynie Collins, MD Center for [facility], [facility]

## 2019-12-29 ENCOUNTER — Other Ambulatory Visit: Payer: Self-pay | Admitting: *Deleted

## 2019-12-29 DIAGNOSIS — O24419 Gestational diabetes mellitus in pregnancy, unspecified control: Secondary | ICD-10-CM

## 2020-01-01 ENCOUNTER — Other Ambulatory Visit: Payer: Self-pay

## 2020-01-09 ENCOUNTER — Ambulatory Visit: Payer: Self-pay | Attending: Family Medicine

## 2020-01-09 ENCOUNTER — Encounter: Payer: Self-pay | Admitting: Family Medicine

## 2020-01-09 ENCOUNTER — Other Ambulatory Visit: Payer: Self-pay

## 2020-01-15 ENCOUNTER — Other Ambulatory Visit: Payer: Self-pay

## 2020-01-15 ENCOUNTER — Encounter: Payer: Self-pay | Admitting: Obstetrics & Gynecology

## 2020-01-15 ENCOUNTER — Ambulatory Visit (INDEPENDENT_AMBULATORY_CARE_PROVIDER_SITE_OTHER): Payer: Self-pay | Admitting: Obstetrics & Gynecology

## 2020-01-15 ENCOUNTER — Other Ambulatory Visit (HOSPITAL_COMMUNITY)
Admission: RE | Admit: 2020-01-15 | Discharge: 2020-01-15 | Disposition: A | Discharge status: Home / Self Care | Payer: Self-pay | Source: Ambulatory Visit | Attending: Obstetrics & Gynecology | Admitting: Obstetrics & Gynecology

## 2020-01-15 VITALS — BP 101/68 | HR 109 | Wt 135.5 lb

## 2020-01-15 DIAGNOSIS — O24420 Gestational diabetes mellitus in childbirth, diet controlled: Secondary | ICD-10-CM

## 2020-01-15 DIAGNOSIS — Z3A35 35 weeks gestation of pregnancy: Secondary | ICD-10-CM

## 2020-01-15 DIAGNOSIS — O0993 Supervision of high risk pregnancy, unspecified, third trimester: Secondary | ICD-10-CM

## 2020-01-15 NOTE — Patient Instructions (Addendum)
[place] Food Resources  Department of Social Services[place] [address], [place], [place] [address] [phone]   or  www.[place]nc.gov/our-county/human-services/social-services **SNAP/EBT/ Other nutritional benefits  [place] DHHS-Public Health-WIC [address], [place], [place] [address] [phone]  or  https://[place]ountync.gov/our-county/human-services/health-department **WIC for  women who are pregnant and postpartum, infants and children up to 5 years old  [facility]lessed Table [facility] Pant[facility] [address], [place], [place] [address] [phone]) [phone]   or   www.theblessedtable.org  **Food pantry  [facility] [address], [place], [place] [address] [phone]   or   https://brotherkolbes.godaddysites.com  **Emergency food and prepared meals  [place] [facility] Food Pantry [address], [place], [place] [address] [phone]   or   www.cedargrovetop.us **Food pantry  [facility] Food Pantry [address], [place], [place] [address] [phone]   or   www.facebook.com/Celia-Phelps-United-Methodist-Church-116430931718202 **Food pantry  [facility] Food Pantry [address], [place], [place] [address] [phone] **Food pantry  [facility] [address], [place], [place] [address] [phone]) [phone]   or   www.greensborourbanministry.org  **Food pantry and prepared meals  [facility] [address], [place], [place] [address] https://jfsgreensboro.org/  **Food pantry  [place] [facility] Food Pantry [address], [place], [place] [address] [phone]   or   www.lbcnow.org  **Food pantry  One Step Further [address], [place], [place] [address] [phone]   or   http://www.onestepfurther.com **Food pantry, nutrition education, gardening activities  [facility] Food Pantry [address], [place], [place] [address] [phone] **Food pantry  [facility] [place] [address], [place], [place] [address] [phone]) [phone]   or   www.salvationarmyofgreensboro.org **Food pantry  [facility] [place] [address], [place], [place] [address] [phone]   or   http://senior-resources-guilford.org **Meals on Wheels Program  [place] [place] [facility] [address], [place], [place] [address] [phone]) [phone]   or   www.stmattchurch.com  **Food pantry  [place] [facility] Food [facility] [address], , [place] [address] [phone]   or   vandaliapresbyterianchurch.org **Food pantry  [place] Food Resources  [place] [facility] Food Pantry [address], [place], [place] [address] [phone]) [phone]   or   www.facebook.com/JulianUMC Food pantry  [place] [facility] [address], [place], [place] [address] [phone] **Food pantry  [place] Food Resources   Department of Public Health[place] [address], [place], [place] [address] [phone]   or   www.co.[place].Muscoda.us/ph/  [place] DHHS-Public Health-WIC ([place]) [address], [place], [place] [address] [phone]) [phone]   or   https://www.[place]ountync.gov/our-county/human-services/health-department **WIC for pregnant and postpartum women, infants and children up to 5 years old  Compassionate Pantry [address], [place], [place] [address] [phone]) [phone] **Food pantry  [place] Food [facility]ry [address], [place], [place] [address] [phone]   or   emerywoodbaptistchurch.com *Food pantry  Five loaves Two Fish Food Pantry [address], [place], [place] [address] [phone]) [phone]   or   www.fcchighpoint.org **Food pantry  [facility] [address], [place],  [address] [phone]) [phone]   or    www.helpinghandshp.org **Food pantry  [facility] [employer] [facility] Food Pantry [address], [place],  [address] [phone]) [phone]   or   www.facebook.com/KBCI1 **Food Pantry  [employer] [facility] Food Pantry [address] [address] [facility]   [address] [place], [place] [address] [phone]   or   www.abbottscreek.org E[employer] of [place] [phone]   **Delivers meals  [facility] Beginnings [facility] [address] [place], [place] [address] [phone]   or   nbfgm.sundaystreamwebsites.com  Tree surgeon of [employer] [address] [place], [place] [address] [phone]   or   www.odm-hp.org  **Food pantry  [address] [place] Food Pantry [address], [place], [place] [address] [phone]   or   R2live.tv **Food pantry  [facility] [address] [place], [place] [address] [phone]   or   WrestlingMonthly.pl **Emergency food and pet food  Senior Adults Association[facility] [address] [place] [address], [place], [place] [address] [phone]   or   www.senioradults.org **Congregate and delivered meals to older adults  [place] [employer] of Greater [employer] [address], [place], [place] [address] [phone]   or   https://www.miller-montoya.com/ **Back Pack Program for elementary school students  [facility] [address], [place], [place] [address] [phone]   or   www.wardstreetcommunityresources.org **Food pantry  [facility] [address], [place], [place] [address] [phone]   or   ResumeQuery.com.ee **Emergency food, nutrition classes, food budgeting  Food Resources [place]  Department of Social [place]  [address], [place], [place] [address] [phone]  or   www.co.[place].Antonito.us/pview.aspx?id=14850&catid=407 **SNAP/Other nutrition benefits  [facility] [address], [place], [place] [address] [phone]   or  FutureSponsors.be  **SNAP/Other nutrition benefits  [facility] Department of Public [facility] Services [address] [place], [place], [place] [address] [phone]  or  http://www.rockinghamcountypublichealth.org **WIC for pregnant and postpartum women, infants and children up to 15 years old  Aging, Disability and Transit [facility] [address] [place], [place] [address] [phone]  or www.BlackjackCoupons.com.br **Prepared meals for older adults  [facility] Food Pantry [address], [place], [place] [address] [phone]  or  http://caldwell-sandoval.com/ **Food pantry  [facility] [address], [place], [place] [address] [phone]   or   https://www.handsofgod.org/  Tour manager  [facility] in [place] Food Pantry [address] [place], [place] [address] [phone] **Food pantry  [facility] [employer] [address] [place], [place], [place] [address] [phone]   or   www.ci.[place].us/government/parks_and_recreation/senior_center/index.php **Congregate meal for older adults  [facility] [address], [place], [place] [address] [phone]   or www.reidsvilleoutreachcenter.org  **Food pantry  [facility] [address] [place], [place] [address] [phone]   or   TelevisionEnthusiast.fr **Food pantry   AREA PEDIATRIC/FAMILY PRACTICE PHYSICIANS  Central/Southeast [place] [phone]) . [facility] Melodie Bouillon, MD; Lum Babe, MD; [place] Slider, MD; Leveda Anna, MD; McDiarmid, MD; Jerene Bears, MD; Jennette Kettle, MD; Gwendolyn Grant, MD o [address] [place], [place] [address] o [phone] o Mon-Fri 8:30-12:30, 1:30-5:00 o Providers come to see babies at [facility] o Accepting Medicaid . [facility] [place] o Limited providers who accept  newborns: Docia Chuck, MD; Kateri Plummer, MD; Paulino Rily, MD o [address], [place], [place] [address] o [phone] o Mon-Fri 8:00-5:30 o Babies seen by providers at [facility] o Does NOT accept Medicaid o Please call early in hospitalization for appointment (limited availability)  . [place] [employer] o Delrae Alfred, MD o [address],  [place], [place] [address] o [phone] o Mon, Tue, Thur, Fri 8:30-5:00, Wed 10:00-7:00 (closed 1-2pm) o Babies seen by [facility] providers o Accepting Medicaid . [place], MD o [place], [place], [place] [address] o [phone] o Mon-Fri 8:30-5:00, Sat 8:30-12:00 o Provider comes to see babies at [place] Medicaid o Must have been referred from current patients or contacted office prior to delivery . [place] [facility] ([place] for [place]) Franne Forts, MD; Tamera Punt, MD; Doneen Poisson, MD; Fatima Sanger, MD; Wynetta Emery, MD; Jess Barters, MD; Tami Ribas, MD; Herbert Moors, MD; Derrell Lolling, MD; Dorothyann Peng, MD; Lucious Groves, NP; Baldo Ash, NP o [place], [place], [place] [address] o [phone] o Mon, Tue, Thur, Fri 8:30-5:30, Wed 9:30-5:30, Sat 8:30-12:30 o Babies seen by [facility] providers o Accepting Medicaid o Only accepting infants of first-time parents or siblings of current patients [facility] discharge coordinator will make follow-up appointment . Baltazar Najjar o [place] [address], [place], [place]  [address] o [phone]   Fax - [phone] . [facility] o [address], [place], [place]  [address] o Phone - [phone]   Fax [phone] . [place], [place], [place], [place]  [address] o [phone]  East/[place]ortheast [place] [phone]) . [facility] Reginal Lutes, MD; Jacklynn Ganong, MD; Torrie Mayers, MD; MD; Rosana Hoes, MD; Servando Salina, MD; Rose Fillers, MD; Rex Kras, MD; Corinna Capra, MD; Volney American, MD;  Trilby Drummer, MD; Janann Colonel, MD; Jimmye Norman, [place], [place], [place] [address] o [phone] o Mon-Fri 8:30-5:00 (extended evenings Mon-Thur as needed), Sat-Sun 10:00-1:00 o Providers come to see babies at [place] Medicaid for families of first-time babies and families with all children in the household age 15 and under. Must register with office prior to making appointment (M-F only). . [place], NP; Tomi Bamberger, MD; Redmond School, MD; [place], [place]., [place], [place] [address] o [phone] o Mon-Fri 8:00-5:00 o Babies seen by providers at [facility] o Does NOT accept Medicaid/Commercial Insurance Only . [facility] at [place] ([place] [facility])  Marnee Guarneri, MD; Drema Dallas, MD; Montine Circle, MD; Vilma Prader, MD; Vanita Panda, MD; Alfonso Ramus, MD; Ruthann Cancer, MD; Roxanne Mins, MD; Rosalva Ferron, MD; Polly Cobia, MD o [address] [place], [place] [address] o [phone] o Mon-Fri 8:30-5:30, Sat (Oct.-Mar.) 9:00-1:00 o Babies seen by providers at [place] [phone]) . [facility] Peers, MD; Suzan Slick, MD o [address], [place], [place] [address] o [phone] o Mon-Fri 8:30-5:00, Sat 8:30-12:00 o Providers come to see babies at [facility] o Does NOT accept Medicaid . [place] at [place], [place]; Coats, MD; [place], [place]; Nancy Fetter, MD; Moreen Fowler, [place], [place],  [address] o [phone] o Mon-Fri 8:00-5:00 o Babies seen by providers at [facility] o Does NOT accept Medicaid o Only accepting babies of parents who are patients o Please call early in hospitalization for appointment (limited availability) . [facility] Blanca Friend, MD; Sharlene Motts, MD; Rod Can, MD; Warner Mccreedy, NP; Sabra Heck, MD; Ermalinda Memos, MD; Sharlett Iles, NP; Aurther Loft, MD; Jerrye Beavers, MD; Marcello Moores, MD; Berline Lopes, MD; Charolette Forward, MD o [place].  [place], [place],  [address] o [phone] o Mon-Fri 8:00-5:00, Sat 9:00-12:00 o Providers come to see babies at [facility] o Does NOT accept [facility] [phone]) . [place] [facility] o Limited providers accepting new patients: McBaine, NP; [place], PA o [address], [place], [place] [address] o [phone] o Mon-Fri 8:00-5:00 o Babies seen by providers at [facility] o Does NOT accept Medicaid o Only accepting babies of parents who are patients o Please call early in hospitalization for appointment (limited availability) . [facility] Luan Pulling, MD; Nash Dimmer, MD o [address]., [place], [place] [address] o [phone] (press 1 to schedule appointment) o Mon-Fri 8:00-5:00 o Providers come to see babies at [facility] o Does NOT accept Medicaid . [facility] Cristino Martes, MD o [address], [place], [place] [address] o [phone] o Mon-Fri 8:30-5:00 (lunch 12:30-1:00), extended hours by appointment only Wed 5:00-6:30 o Babies seen by [facility] providers o Accepting Medicaid . [facility]vere Abbot, MD; [place], MD; Hassan Rowan, MD o [address] [place], [place], [place] [address] o [phone] o Mon-Fri 8:00-5:00 o Babies seen by [facility] providers o Does NOT accept Medicaid . Nature conservation officer at [place] [address] [place] Soho, MD; Durene Cal, MD; X[place], DO o [address] [place], [place] [address] o [phone] o Mon-Fri 8:00-5:00 o Babies seen by [facility] providers o Does NOT accept Medicaid . [facility] o [place], [place]; [place], [place]; [place], NP; Avis Epley, MD; Vonna Kotyk, MD; Clance Boll, MD; Stevphen Rochester, NP; Arvilla Market, NP; Ann Maki, NP; Otis Dials, NP; Vaughan Basta, MD; [place], MD o [address] [place], [place] [address] o [phone] o Mon-Fri 8:30-5:00, Sat 10:00-1:00 o Providers come to see babies at [facility] o Does NOT accept Medicaid o Free  prenatal information session Tuesdays at 4:45pm . [facility] Luna Kitchens, MD; [place], [place]; [place], [place]; [place], [place] o [address] [place] [address] o [phone] o Mon-Fri 7:30-5:30 o Babies seen by [facility] providers . [facility] Doctor o [address], [place], [place]  [address] o [phone]   Fax - [phone]  [place] [phone] & [phone]) . [facility] Alphonsa Overall, MD o [address] [place], [place] [address] o [phone] o Mon-Thur 8:00-6:00 o Providers come to see babies at [facility] o Accepting Medicaid . [facility] Zenon Mayo, NP; Cyndia Bent, MD; [place], [place]; [place], [place] o [address] [place], [place] [address] o [phone] o Mon-Thur 7:30-7:30, Fri 7:30-4:30 o Babies seen by [facility] providers o Accepting Medicaid . [facility] Cheryle Horsfall, MD; Janene Harvey, NP; Vonita Moss, MD o [address], [place], [place] [address] o [phone] o Mon-Fri 8:30-5:00, Sat 8:30-12:00 o Providers come to see babies at [facility] o Accepting Medicaid o Must have "Meet & Greet" appointment at office prior to delivery . [facility] - [place] ([facility]) Llana Aliment, MD; Earlene Plater, MD; Lucretia Roers, MD o [address], [place], [place] [address] o [phone] o Mon-Wed 8:00-6:00, Thur-Fri 8:00-5:00, Sat 9:00-12:00 o Providers come to see babies at [facility] o Does NOT accept Medicaid o Only accepting siblings of current patients . [facility] [place]  o [address], [place], [place]  [address] o [phone]   Fax - [phone] . [facility] at [facility] o [phone] [address] [place], [place]  [address] o [phone]   Fax - [phone]  [place]west [place] [phone] & [phone]) . Nature conservation officer at [facility] o [place], DO;  [place], DO o [address] [place], [place] [address] o [phone] o Mon-Fri 7:00-5:00 o Babies seen by  [facility] providers o Does NOT accept Medicaid . [facility] Ellis Savage, MD; [place], [place]; [place], [place] o [address], [place], [place] [address] o [phone] o Mon-Fri 8:00-5:00 o Babies seen by [facility] providers o Accepting Medicaid . [facility] - [address] Franne Forts, MD; [place], [place]; [place], NP; [place], [place] o [address] [place], [place], [place] [address] o [phone] o Mon-Fri 8:00-5:00 o Babies seen by providers at [facility] o Accepting [facility] [place] [phone]) . [facility] [place], [place] o [address] [place], [place] [address] o [phone] o Mon-Fri 8:00-5:00 o Babies seen by [facility] providers o Does NOT accept Medicaid o Limited availability, please call early in hospitalization to schedule follow-up . [facility] Jolee Ewing, PA; Eddie Candle, MD; [place], MD; [place], [place]; Constance Goltz, MD; [place], [place] o [facility] [facility] [address], [place], [place] [address] o [phone] o Mon-Fri 8:30-5:00, Sat 9:00-12:00 o Babies seen by providers at [facility] o Accepting Medicaid o Please register online then schedule online or call office o [email or web address] . [facility] - [facility] at [facility]) Samuella Bruin, NP; Lucianne Muss, MD; Lanier Clam, PA o [address], [place], [place] [address] o [phone] o Mon-Fri 8:00-5:00 o Babies seen by providers at [facility] o Accepting Medicaid . [facility] - [facility] ([facility]) Sharin Mons, MD; Reed Breech, NP; Shelva Majestic, MD o [address], [place], [place] [address] o [phone] o Mon-Fri 8:00-5:30, Sat&Sun by appointment (phones open at 8:30) o Babies seen by  [facility] providers o Accepting Medicaid o Must be a first-time baby or sibling of current patient . [facility] [address], [place], [place]  [address] o [phone]   Fax - [phone]  [place] [phone] & [phone]) . [facility] o [place], [place]; [place], [place]; Dimple Casey, MD; [place], [place]; Carolyne Fiscal, MD o [address], [place], [place] [address] o [phone] o Mon-Thur 8:00-7:00, Fri 8:00-5:00, Sat 8:00-12:00, Sun 9:00-12:00 o Babies seen by [facility] providers o Accepting Medicaid . [facility] Family Medicine at [facility], MD; Gaynell Face, MD; [facility], MD o [address], [place], [place] [address] o [phone] o Mon-Thur 8:00-5:00 o Babies seen by providers at [facility] o Accepting Medicaid . [facility] Family Medicine at [place] Gwenlyn Saran, MD; Coe[place], MD; Madilyn Fireman, MD; Melvyn Neth, MD; List, MD; Lazarus Salines, MD; Gaynell Face, MD; Berneda Rose, MD; Flora Lipps, MD; Beryl Meager, MD; Luther Redo, MD; Lavonia Drafts, MD; Kellie Simmering, MD o [address] [place], [place] [address] o [phone] o Mon-Fri 8:00-5:30, Sat (Oct.-Mar.) 9:00-1:00 o Babies seen by providers at [facility] o Accepting Medicaid o Must fill out new patient packet, available online at [email or web address] . [facility] - [place] [facility] Pediatrics at [facility]) Simone Curia, NP; Tiburcio Pea, NP; Tresa Endo, NP; Whitney Post, MD; [place], [place]; Hennie Duos, MD; Wynne Dust, MD; Kavin Leech, NP o [address], [place], [place] [address] o [phone] o Mon-Thur 8:00-5:30, Fri 8:00-5:00 o Babies seen by providers at [facility] o Accepting [facility] [phone]) . [facility] o [place], [place]; [place], MD; Tanya Nones, MD; [place], [place] o [address] [place], [place] [address] o [phone] o Mon-Fri 8:00-5:00 o Babies seen by providers at  [facility] o Accepting   [facility] LPMedicaid   [place] [phone]) . [facility] o Taconic ShoresMasneri, DO; Lenise ArenaMeyers, MD; [place], [place] o [address], [place], [place] [address] o [phone] o Mon-Fri 8:00-5:00 o Babies seen by providers at [facility] o Does NOT accept Medicaid o Limited appointment availability, please call early in hospitalization  . Nature conservation officerLeBauer HealthCare at [facility] o BoutteKunedd, DO; [place], MD o [address], [place], [place] [address] o [phone] o Mon-Fri 8:00-5:00 o Babies seen by [facility] providers o Does NOT accept Medicaid . [facility] Lorrine Kino Cameron, MD; Ninetta LightsMacDonald, MD; [place], [place]; TolnaNayak, MD o [address], [place], [place] [address] o [phone] o Mon-Fri 8:00-5:00 o After hours clinic [facility]([address] [place], [place] [address]) [phone] Mon-Fri 5:00-8:00, Sat 12:00-6:00, Sun 10:00-4:00 o Babies seen by [facility] providers o Accepting Medicaid . [facility] at [facility] o [address], [place], [place]  [address] o [phone]   Fax - [phone]  [place] [phone]) . Nature conservation officerLeBauer HealthCare at [facility] o Andy, MD o [address] [place], [place], [place] [address] o [phone] o Mon-Fri 8:00-5:00 o Babies seen by [facility] providers o Does NOT accept Medicaid . [facility] - [place] [facility] [place]) Tomi Likenso Eksir, MD o [address] [address], [place], [place] [address] o [phone] o Mon-Thur 8:00-7:00, Fri 8:00-5:00, Sat 8:00-12:00 o [facility] o Accepting Medicaid - but does not have vaccinations in office (must be received elsewhere) o Limited availability, please call early in hospitalization  [place] ([id number]) . [facility]  o Wyvonne Lenzharlene Flemming, MD o [address], [place] [address] o [phone]  Fax [phone]

## 2020-01-15 NOTE — Progress Notes (Signed)
   PRENATAL VISIT NOTE  Subjective:  Nichole Hughes is a 34 y.o. G2P1001 at [redacted]w[redacted]d being seen today for ongoing prenatal care.  She is currently monitored for the following issues for this high-risk pregnancy and has Language barrier; Supervision of high-risk pregnancy; and Gestational diabetes mellitus (GDM) in childbirth, diet controlled on their problem list.  Patient reports no complaints.  Contractions: Irritability. Vag. Bleeding: None.  Movement: Present. Denies leaking of fluid.   The following portions of the patient's history were reviewed and updated as appropriate: allergies, current medications, past family history, past medical history, past social history, past surgical history and problem list.   Objective:   Vitals:   01/15/20 1526  BP: 101/68  Pulse: (!) 109  Weight: 135 lb 8 oz (61.5 kg)    Fetal Status: Fetal Heart Rate (bpm): 132   Movement: Present     General:  Alert, oriented and cooperative. Patient is in no acute distress.  Skin: Skin is warm and dry. No rash noted.   Cardiovascular: Normal heart rate noted  Respiratory: Normal respiratory effort, no problems with respiration noted  Abdomen: Soft, gravid, appropriate for gestational age.  Pain/Pressure: Present     Pelvic: Cervical exam performed in the presence of a chaperone      FT/50/-3 Vtx  Extremities: Normal range of motion.  Edema: None  Mental Status: Normal mood and affect. Normal behavior. Normal judgment and thought content.      Assessment and Plan:  Pregnancy: G2P1001 at [redacted]w[redacted]d 1. Supervision of high risk pregnancy in third trimester GBS and cultures today.  Wants video visit next time.   2. Gestational diabetes mellitus (GDM) in childbirth, diet controlled Pt has some low levels; not on meds.  Doesn't feel bad when it is in 40s. Patient thinks her meter is not working right.  Unable to compare our meter to her meter today (our meter not working).  Will check both meters next  time she comes in. Needs growth [place].  Needs induction 39-40 weeks if remains stable.    Preterm labor symptoms and general obstetric precautions including but not limited to vaginal bleeding, contractions, leaking of fluid and fetal movement were reviewed in detail with the patient. Please refer to After Visit Summary for other counseling recommendations.   Video visit in 10 days.  Elsie Lincoln, MD

## 2020-01-16 LAB — POCT URINALYSIS DIP (DEVICE)
Bilirubin Urine: NEGATIVE
Glucose, UA: NEGATIVE mg/dL
Ketones, ur: NEGATIVE mg/dL
Leukocytes,Ua: NEGATIVE
Nitrite: NEGATIVE
Protein, ur: NEGATIVE mg/dL
Specific Gravity, Urine: >=1.03 (ref 1.005–1.030)
Urobilinogen, UA: 0.2 mg/dL (ref 0.0–1.0)
pH: 7 (ref 5.0–8.0)

## 2020-01-16 LAB — GC/CHLAMYDIA PROBE AMP (~~LOC~~) NOT AT ARMC
Chlamydia: NEGATIVE
Comment: NEGATIVE
Comment: NORMAL
Neisseria Gonorrhea: NEGATIVE

## 2020-01-18 LAB — CULTURE, BETA STREP (GROUP B ONLY): Strep Gp B Culture: NEGATIVE

## 2020-01-26 ENCOUNTER — Ambulatory Visit (INDEPENDENT_AMBULATORY_CARE_PROVIDER_SITE_OTHER): Payer: Medicaid Other | Admitting: Family Medicine

## 2020-01-26 ENCOUNTER — Encounter: Payer: Self-pay | Admitting: Family Medicine

## 2020-01-26 ENCOUNTER — Other Ambulatory Visit: Payer: Self-pay

## 2020-01-26 VITALS — BP 103/73 | HR 77

## 2020-01-26 DIAGNOSIS — O0993 Supervision of high risk pregnancy, unspecified, third trimester: Secondary | ICD-10-CM

## 2020-01-26 DIAGNOSIS — Z603 Acculturation difficulty: Secondary | ICD-10-CM

## 2020-01-26 DIAGNOSIS — O24410 Gestational diabetes mellitus in pregnancy, diet controlled: Secondary | ICD-10-CM

## 2020-01-26 DIAGNOSIS — Z789 Other specified health status: Secondary | ICD-10-CM | POA: Diagnosis not present

## 2020-01-26 DIAGNOSIS — O24420 Gestational diabetes mellitus in childbirth, diet controlled: Secondary | ICD-10-CM

## 2020-01-26 DIAGNOSIS — Z3A36 36 weeks gestation of pregnancy: Secondary | ICD-10-CM | POA: Diagnosis not present

## 2020-01-26 NOTE — Progress Notes (Signed)
   TELEHEALTH VIRTUAL OBSTETRICS VISIT ENCOUNTER NOTE  I connected with Kathryne Gin Musso on 01/26/20 at 10:35 AM EDT by telephone at home and verified that I am speaking with the correct person using two identifiers.   I discussed the limitations, risks, security and privacy concerns of performing an evaluation and management service by telephone and the availability of in person appointments. I also discussed with the patient that there may be a patient responsible charge related to this service. The patient expressed understanding and agreed to proceed.  Subjective:  Docia Klar is a 34 y.o. G2P1001 at [redacted]w[redacted]d being followed for ongoing prenatal care.  She is currently monitored for the following issues for this high-risk pregnancy and has Language barrier; Supervision of high-risk pregnancy; and Gestational diabetes mellitus (GDM) in childbirth, diet controlled on their problem list.  Patient reports no complaints. Reports fetal movement. Denies any contractions, bleeding or leaking of fluid.   GDM: Patient diet controlled.  Reports occasional asymptomatic hypoglycemic episodes.  Tolerating medication well Fasting: 72, 82, 93, 88, 48, 81, 90 2hr PP: 106, 60, 110, 150  The following portions of the patient's history were reviewed and updated as appropriate: allergies, current medications, past family history, past medical history, past social history, past surgical history and problem list.   Objective:   General:  Alert, oriented and cooperative.   Mental Status: Normal mood and affect perceived. Normal judgment and thought content.  Rest of physical exam deferred due to type of encounter  Assessment and Plan:  Pregnancy: G2P1001 at [redacted]w[redacted]d 1. Supervision of high risk pregnancy in third trimester Good fetal movement  2. Gestational diabetes mellitus (GDM) in childbirth, diet controlled Continue CBGs. Only checks twice a day due to fear of needles. Has someone  use the lancets.  3. Language barrier Interpreter used.  Term labor symptoms and general obstetric precautions including but not limited to vaginal bleeding, contractions, leaking of fluid and fetal movement were reviewed in detail with the patient.  I discussed the assessment and treatment plan with the patient. The patient was provided an opportunity to ask questions and all were answered. The patient agreed with the plan and demonstrated an understanding of the instructions. The patient was advised to call back or seek an in-person office evaluation/go to [facility] at [facility] for any urgent or concerning symptoms. Please refer to After Visit Summary for other counseling recommendations.   I provided 11 minutes of non-face-to-face time during this encounter.  No follow-ups on file.  Future Appointments  Date Time Provider Department Center  01/29/2020  3:15 PM [facility] NURSE [facility]  01/29/2020  3:15 PM [facility]    Levie Heritage, DO Center for [employer], [facility]

## 2020-01-26 NOTE — Progress Notes (Signed)
[employer] # [phone]

## 2020-01-29 ENCOUNTER — Encounter: Payer: Self-pay | Admitting: *Deleted

## 2020-01-29 ENCOUNTER — Ambulatory Visit: Payer: Self-pay | Admitting: *Deleted

## 2020-01-29 ENCOUNTER — Ambulatory Visit: Payer: Medicaid Other | Attending: Nurse Practitioner

## 2020-01-29 ENCOUNTER — Other Ambulatory Visit: Payer: Self-pay

## 2020-01-29 DIAGNOSIS — Z3A37 37 weeks gestation of pregnancy: Secondary | ICD-10-CM | POA: Diagnosis not present

## 2020-01-29 DIAGNOSIS — O24410 Gestational diabetes mellitus in pregnancy, diet controlled: Secondary | ICD-10-CM | POA: Diagnosis not present

## 2020-01-29 DIAGNOSIS — O0993 Supervision of high risk pregnancy, unspecified, third trimester: Secondary | ICD-10-CM

## 2020-01-29 DIAGNOSIS — O24420 Gestational diabetes mellitus in childbirth, diet controlled: Secondary | ICD-10-CM | POA: Insufficient documentation

## 2020-01-29 DIAGNOSIS — Z362 Encounter for other antenatal screening follow-up: Secondary | ICD-10-CM | POA: Diagnosis not present

## 2020-01-29 DIAGNOSIS — O359XX0 Maternal care for (suspected) fetal abnormality and damage, unspecified, not applicable or unspecified: Secondary | ICD-10-CM | POA: Diagnosis not present

## 2020-02-02 ENCOUNTER — Encounter: Payer: Self-pay | Admitting: Obstetrics & Gynecology

## 2020-02-02 ENCOUNTER — Ambulatory Visit (INDEPENDENT_AMBULATORY_CARE_PROVIDER_SITE_OTHER): Payer: Self-pay | Admitting: Obstetrics & Gynecology

## 2020-02-02 ENCOUNTER — Other Ambulatory Visit: Payer: Self-pay

## 2020-02-02 VITALS — BP 107/67 | HR 79 | Wt 136.0 lb

## 2020-02-02 DIAGNOSIS — Z789 Other specified health status: Secondary | ICD-10-CM

## 2020-02-02 DIAGNOSIS — Z603 Acculturation difficulty: Secondary | ICD-10-CM

## 2020-02-02 DIAGNOSIS — O24410 Gestational diabetes mellitus in pregnancy, diet controlled: Secondary | ICD-10-CM

## 2020-02-02 DIAGNOSIS — O24420 Gestational diabetes mellitus in childbirth, diet controlled: Secondary | ICD-10-CM

## 2020-02-02 DIAGNOSIS — Z3A37 37 weeks gestation of pregnancy: Secondary | ICD-10-CM

## 2020-02-02 DIAGNOSIS — O0993 Supervision of high risk pregnancy, unspecified, third trimester: Secondary | ICD-10-CM

## 2020-02-02 MED ORDER — DOCUSATE SODIUM 100 MG PO CAPS: 100.0000 mg | ORAL_CAPSULE | Freq: Two times a day (BID) | ORAL | 2 refills | Status: DC | PRN

## 2020-02-02 NOTE — Progress Notes (Signed)
   PRENATAL VISIT NOTE  Subjective:  Nichole Hughes is a 34 y.o. G2P1001 at [redacted]w[redacted]d being seen today for ongoing prenatal care.  She is currently monitored for the following issues for this high-risk pregnancy and has Language barrier; Supervision of high-risk pregnancy; and Gestational diabetes mellitus (GDM) in childbirth, diet controlled on their problem list.  Patient reports no complaints.  Contractions: Not present. Vag. Bleeding: None.  Movement: Present. Denies leaking of fluid.   The following portions of the patient's history were reviewed and updated as appropriate: allergies, current medications, past family history, past medical history, past social history, past surgical history and problem list.   Objective:   Vitals:   02/02/20 0820  BP: 107/67  Pulse: 79  Weight: 136 lb (61.7 kg)    Fetal Status: Fetal Heart Rate (bpm): 136   Movement: Present     General:  Alert, oriented and cooperative. Patient is in no acute distress.  Skin: Skin is warm and dry. No rash noted.   Cardiovascular: Normal heart rate noted  Respiratory: Normal respiratory effort, no problems with respiration noted  Abdomen: Soft, gravid, appropriate for gestational age.  Pain/Pressure: Absent     Pelvic: Cervical exam deferred        Extremities: Normal range of motion.  Edema: None  Mental Status: Normal mood and affect. Normal behavior. Normal judgment and thought content.   Assessment and Plan:  Pregnancy: G2P1001 at [redacted]w[redacted]d 1. Supervision of high risk pregnancy in third trimester constipation - docusate sodium (COLACE) 100 MG capsule; Take 1 capsule (100 mg total) by mouth 2 (two) times daily as needed.  Dispense: 30 capsule; Refill: 2  2. Gestational diabetes mellitus (GDM) in childbirth, diet controlled BS 90%+ in range on diet  3. Language barrier Nl growth on [place]  Term labor symptoms and general obstetric precautions including but not limited to vaginal bleeding,  contractions, leaking of fluid and fetal movement were reviewed in detail with the patient. Please refer to After Visit Summary for other counseling recommendations.   Return in about 1 week (around 02/09/2020).  No future appointments.  Scheryl Darter, MD

## 2020-02-02 NOTE — Patient Instructions (Signed)
Parto vaginal Vaginal Delivery  Parto vaginal significa que usted da a luz empujando al beb fuera del canal del parto (vagina). Un equipo de proveedores de atencin mdica la ayudar antes, durante y despus del parto vaginal. Las experiencias de los nacimientos son nicas para todas las mujeres, y cada embarazo y las experiencias de nacimiento varan segn dnde elija dar a luz. Qu ocurrir cuando llegue al centro de parto o al hospital? Una vez que se inicie el trabajo de parto y haya sido admitida en el hospital o centro de parto, el mdico podr hacer lo siguiente:  Revisar sus antecedentes de embarazo y cualquier inquietud que usted pueda tener.  Colocarle una va intravenosa en una de las venas. Esto se podr usar para administrarle lquidos y medicamentos.  Verificar su presin arterial, pulso, temperatura y frecuencia cardaca (signos vitales).  Verificar si la bolsa de agua (saco amnitico) se ha roto (ruptura).  Hablar con usted sobre su plan de nacimiento y analizar las opciones para controlar el dolor. Monitoreo Su mdico puede monitorear las contracciones (monitoreo uterino) y la frecuencia cardaca del beb (monitoreo fetal). Es posible que el monitoreo se necesite realizar:  Con frecuencia, pero no continuamente (intermitentemente).  Todo el tiempo o durante largos perodos a la vez (continuamente). El monitoreo continuo puede ser necesario si: ? Est recibiendo determinados medicamentos, tales como medicamentos para aliviar el dolor o para hacer que las contracciones sean ms fuertes. ? Tiene complicaciones durante el embarazo o el trabajo de parto. El monitoreo se puede realizar:  Al colocar un estetoscopio especial o un dispositivo manual de monitoreo en el abdomen o verificar los latidos cardacos del beb y comprobar las contracciones.  Al colocar monitores en el abdomen (monitores externos) para registrar los latidos cardacos del beb y la frecuencia y duracin de  las contracciones.  Al colocar monitores dentro del tero a travs de la vagina (monitores internos) para registrar los latidos cardacos del beb y la frecuencia, duracin y fuerza de sus contracciones. Segn el tipo de monitor, puede permanecer en el tero o en la cabeza del beb hasta el nacimiento.  Telemetra. Se trata de un tipo de monitoreo continuo que se puede realizar con monitores externos o internos. En lugar de tener que permanecer en la cama, usted puede moverse durante la telemetra. Examen fsico Su mdico puede realizar exmenes fsicos frecuentes. Esto puede incluir lo siguiente:  Verificar cmo y dnde el beb est ubicado en el tero.  Verificar el cuello uterino para determinar: ? Si se est afinando o estirando (borrando). ? Si se est abriendo (dilatando). Qu sucede durante el trabajo de parto y el parto?  El trabajo de parto y el parto normales se dividen en tres etapas: Etapa 1  Esta es la etapa ms larga del trabajo de parto.  Esta etapa puede durar horas o das.  Durante esta etapa, sentir contracciones. En general, las contracciones son leves, infrecuentes e irregulares al principio. Se hacen ms fuertes, ms frecuentes (aproximadamente cada 2 o 3 minutos) y ms regulares a medida que avanza en esta etapa.  Esta etapa finaliza cuando el cuello uterino est completamente dilatado hasta 4 pulgadas (10cm) y completamente borrado. Etapa 2  Esta etapa comienza una vez que el cuello uterino est totalmente borrado y dilatado, y dura hasta el nacimiento del beb.  Esta etapa puede durar de 20 minutos a 2 horas.  Esta es la etapa en la que va a sentir ganas de pujar al beb fuera de la   vagina.  Puede sentir un dolor urente y por estiramiento, especialmente cuando la parte ms ancha de la cabeza del beb pasa a travs de la abertura vaginal (coronacin).  Una vez que el beb nace, el cordn umbilical se pinzar y se cortar. Esto ocurre por lo general despus  de un perodo de 1 a 2 minutos despus del parto.  Colocarn al beb sobre su pecho desnudo (contacto piel con piel) en una posicin erguida y cubierto con una manta abrigada. Observe al beb para detectar seales de hambre, como el reflejo de bsqueda o succin, y acrquelo al pecho para su primera alimentacin. Etapa 3  Esta etapa comienza inmediatamente despus del nacimiento del beb y finaliza despus de la expulsin de la placenta.  Esta etapa puede durar de 5 a 30 minutos.  Despus del nacimiento del beb, puede sentir contracciones cuando el cuerpo expulsa la placenta y el tero se contrae para controlar la hemorragia. Qu puedo esperar despus del trabajo de parto y el parto?  Una vez que termine el trabajo de parto, se los controlar a usted y al beb atentamente para tener la seguridad de que ambos estn sanos y listos para ir a casa. Su equipo de atencin mdica le ensear cmo cuidarse y cuidar a su beb.  Usted y el beb permanecern en la misma habitacin (cohabitacin) durante su estada en el hospital. Esto estimular una vinculacin temprana y una lactancia exitosa.  Puede seguir recibiendo lquidos o medicamentos por va intravenosa.  Se le controlar y masajear el tero con regularidad (masaje fndico).  Tendr algo de inflamacin y dolor en el abdomen, la vagina y la zona de la piel entre la abertura vaginal y el ano (perineo).  Si se le realiz una incisin cerca de la vagina (episiotoma) o si ha tenido algn desgarro durante el parto, podran indicarle que se coloque compresas fras sobre la episiotoma o el desgarro. Esto ayuda a aliviar el dolor y la hinchazn.  Es posible que le den una botella rociadora para que use cuando vaya al bao para higienizarse. Siga los pasos a continuacin para usar la botella rociadora: ? Antes de orinar, llene la botella rociadora con agua tibia. No use agua caliente. ? Despus de orinar, mientras an est sentada en el inodoro,  use la botella rociadora para enjuagar el rea alrededor de la uretra y la abertura vaginal. Con esto podr limpiar cualquier rastro de orina y sangre. ? Llene la botella rociadora con agua limpia cada vez que vaya al bao.  Es normal tener hemorragia vaginal despus del parto. Use un apsito sanitario para el sangrado vaginal y secrecin. Resumen  Parto vaginal significa que usted dar a luz empujando al beb fuera del canal del parto (vagina).  Su mdico puede monitorear las contracciones (monitoreo uterino) y la frecuencia cardaca del beb (monitoreo fetal).  Su mdico puede realizarle un examen fsico.  El trabajo de parto y el parto normales se dividen en tres etapas.  Una vez que termina el trabajo de parto, se los controlar a usted y al beb atentamente hasta que estn listos para ir a casa. Esta informacin no tiene como fin reemplazar el consejo del mdico. Asegrese de hacerle al mdico cualquier pregunta que tenga. Document Revised: 11/10/2017 Document Reviewed: 11/10/2017 Elsevier Patient Education  2020 [employer]  

## 2020-02-08 ENCOUNTER — Other Ambulatory Visit: Payer: Self-pay

## 2020-02-08 ENCOUNTER — Inpatient Hospital Stay (HOSPITAL_COMMUNITY): Payer: Medicaid Other | Admitting: Anesthesiology

## 2020-02-08 ENCOUNTER — Encounter (HOSPITAL_COMMUNITY): Payer: Self-pay | Admitting: Obstetrics & Gynecology

## 2020-02-08 ENCOUNTER — Inpatient Hospital Stay (HOSPITAL_COMMUNITY)
Admission: AD | Admit: 2020-02-08 | Discharge: 2020-02-10 | DRG: 807 | Disposition: A | Discharge status: Home / Self Care | Payer: Medicaid Other | Attending: Obstetrics & Gynecology | Admitting: Obstetrics & Gynecology

## 2020-02-08 DIAGNOSIS — O26893 Other specified pregnancy related conditions, third trimester: Secondary | ICD-10-CM | POA: Diagnosis present

## 2020-02-08 DIAGNOSIS — Z20822 Contact with and (suspected) exposure to covid-19: Secondary | ICD-10-CM | POA: Diagnosis present

## 2020-02-08 DIAGNOSIS — Z3A38 38 weeks gestation of pregnancy: Secondary | ICD-10-CM | POA: Diagnosis not present

## 2020-02-08 DIAGNOSIS — Z789 Other specified health status: Secondary | ICD-10-CM | POA: Diagnosis present

## 2020-02-08 DIAGNOSIS — O24420 Gestational diabetes mellitus in childbirth, diet controlled: Secondary | ICD-10-CM | POA: Diagnosis present

## 2020-02-08 DIAGNOSIS — O0993 Supervision of high risk pregnancy, unspecified, third trimester: Secondary | ICD-10-CM

## 2020-02-08 LAB — CBC
HCT: 39.4 % (ref 36.0–46.0)
Hemoglobin: 12.8 g/dL (ref 12.0–15.0)
MCH: 31 pg (ref 26.0–34.0)
MCHC: 32.5 g/dL (ref 30.0–36.0)
MCV: 95.4 fL (ref 80.0–100.0)
Platelets: 262 10*3/uL (ref 150–400)
RBC: 4.13 MIL/uL (ref 3.87–5.11)
RDW: 14.7 % (ref 11.5–15.5)
WBC: 11 10*3/uL — ABNORMAL HIGH (ref 4.0–10.5)
nRBC: 0 % (ref 0.0–0.2)

## 2020-02-08 LAB — COMPREHENSIVE METABOLIC PANEL
ALT: 14 U/L (ref 0–44)
AST: 24 U/L (ref 15–41)
Albumin: 3.1 g/dL — ABNORMAL LOW (ref 3.5–5.0)
Alkaline Phosphatase: 214 U/L — ABNORMAL HIGH (ref 38–126)
Anion gap: 13 (ref 5–15)
BUN: 9 mg/dL (ref 6–20)
CO2: 18 mmol/L — ABNORMAL LOW (ref 22–32)
Calcium: 8.8 mg/dL — ABNORMAL LOW (ref 8.9–10.3)
Chloride: 104 mmol/L (ref 98–111)
Creatinine, Ser: 0.5 mg/dL (ref 0.44–1.00)
GFR calc Af Amer: >60 mL/min (ref >60)
GFR calc non Af Amer: >60 mL/min (ref >60)
Glucose, Bld: 83 mg/dL (ref 70–99)
Potassium: 4.2 mmol/L (ref 3.5–5.1)
Sodium: 135 mmol/L (ref 135–145)
Total Bilirubin: 1 mg/dL (ref 0.3–1.2)
Total Protein: 6.6 g/dL (ref 6.5–8.1)

## 2020-02-08 LAB — GLUCOSE, CAPILLARY: Glucose-Capillary: 143 mg/dL — ABNORMAL HIGH (ref 70–99)

## 2020-02-08 LAB — TYPE AND SCREEN
ABO/RH(D): O POS
Antibody Screen: NEGATIVE

## 2020-02-08 LAB — SARS CORONAVIRUS 2 BY RT PCR (HOSPITAL ORDER, PERFORMED IN ~~LOC~~ HOSPITAL LAB): SARS Coronavirus 2: NEGATIVE

## 2020-02-08 LAB — ABO/RH: ABO/RH(D): O POS

## 2020-02-08 MED ORDER — LIDOCAINE HCL (PF) 1 % IJ SOLN
INTRAMUSCULAR | Status: DC | PRN
  Administered 2020-02-08: 6 mL via EPIDURAL

## 2020-02-08 MED ORDER — FENTANYL-BUPIVACAINE-NACL 0.5-0.125-0.9 MG/250ML-% EP SOLN
12.0000 mL/h | EPIDURAL | Status: DC | PRN
  Filled 2020-02-08: qty 250

## 2020-02-08 MED ORDER — DIPHENHYDRAMINE HCL 50 MG/ML IJ SOLN: 12.5000 mg | INTRAMUSCULAR | Status: DC | PRN

## 2020-02-08 MED ORDER — SOD CITRATE-CITRIC ACID 500-334 MG/5ML PO SOLN: 30.0000 mL | ORAL | Status: DC | PRN

## 2020-02-08 MED ORDER — LIDOCAINE HCL (PF) 1 % IJ SOLN: 30.0000 mL | INTRAMUSCULAR | Status: DC | PRN

## 2020-02-08 MED ORDER — OXYCODONE-ACETAMINOPHEN 5-325 MG PO TABS: 1.0000 | ORAL_TABLET | ORAL | Status: DC | PRN

## 2020-02-08 MED ORDER — SODIUM CHLORIDE (PF) 0.9 % IJ SOLN
INTRAMUSCULAR | Status: DC | PRN
  Administered 2020-02-08: 12 mL/h via EPIDURAL

## 2020-02-08 MED ORDER — LACTATED RINGERS IV SOLN
500.0000 mL | Freq: Once | INTRAVENOUS | Status: AC
  Administered 2020-02-08: 500 mL via INTRAVENOUS

## 2020-02-08 MED ORDER — ONDANSETRON HCL 4 MG/2ML IJ SOLN: 4.0000 mg | Freq: Four times a day (QID) | INTRAMUSCULAR | Status: DC | PRN

## 2020-02-08 MED ORDER — EPHEDRINE 5 MG/ML INJ: 10.0000 mg | INTRAVENOUS | Status: DC | PRN

## 2020-02-08 MED ORDER — PHENYLEPHRINE 40 MCG/ML (10ML) SYRINGE FOR IV PUSH (FOR BLOOD PRESSURE SUPPORT): 80.0000 ug | PREFILLED_SYRINGE | INTRAVENOUS | Status: DC | PRN

## 2020-02-08 MED ORDER — PHENYLEPHRINE 40 MCG/ML (10ML) SYRINGE FOR IV PUSH (FOR BLOOD PRESSURE SUPPORT)
80.0000 ug | PREFILLED_SYRINGE | INTRAVENOUS | Status: DC | PRN
  Filled 2020-02-08: qty 10

## 2020-02-08 MED ORDER — OXYTOCIN 40 UNITS IN NORMAL SALINE INFUSION - SIMPLE MED
2.5000 [IU]/h | INTRAVENOUS | Status: DC
  Administered 2020-02-08: 2.5 [IU]/h via INTRAVENOUS
  Filled 2020-02-08: qty 1000

## 2020-02-08 MED ORDER — FENTANYL-BUPIVACAINE-NACL 0.5-0.125-0.9 MG/250ML-% EP SOLN: 12.0000 mL/h | EPIDURAL | Status: DC | PRN

## 2020-02-08 MED ORDER — ACETAMINOPHEN 325 MG PO TABS: 650.0000 mg | ORAL_TABLET | ORAL | Status: DC | PRN

## 2020-02-08 MED ORDER — DIPHENHYDRAMINE HCL 50 MG/ML IJ SOLN
12.5000 mg | INTRAMUSCULAR | Status: DC | PRN
Start: 2020-02-08 — End: 2020-02-08

## 2020-02-08 MED ORDER — OXYCODONE-ACETAMINOPHEN 5-325 MG PO TABS: 2.0000 | ORAL_TABLET | ORAL | Status: DC | PRN

## 2020-02-08 MED ORDER — OXYTOCIN BOLUS FROM INFUSION
500.0000 mL | Freq: Once | INTRAVENOUS | Status: AC
  Administered 2020-02-08: 500 mL via INTRAVENOUS

## 2020-02-08 MED ORDER — LACTATED RINGERS IV SOLN: INTRAVENOUS | Status: DC

## 2020-02-08 MED ORDER — LACTATED RINGERS IV SOLN: 500.0000 mL | INTRAVENOUS | Status: DC | PRN

## 2020-02-08 NOTE — Anesthesia Preprocedure Evaluation (Signed)
Anesthesia Evaluation  Patient identified by MRN, date of birth, ID band Patient awake    Reviewed: Allergy & Precautions, H&P , NPO status , Patient's Chart, lab work & pertinent test results, reviewed documented beta blocker date and time   Airway Mallampati: II  TM Distance: >3 FB Neck ROM: full    Dental no notable dental hx.    Pulmonary neg pulmonary ROS,    Pulmonary exam normal breath sounds clear to auscultation       Cardiovascular negative cardio ROS Normal cardiovascular exam Rhythm:regular Rate:Normal     Neuro/Psych negative neurological ROS  negative psych ROS   GI/Hepatic negative GI ROS, Neg liver ROS,   Endo/Other  diabetes, Gestational  Renal/GU negative Renal ROS  negative genitourinary   Musculoskeletal   Abdominal   Peds  Hematology negative hematology ROS (+)   Anesthesia Other Findings   Reproductive/Obstetrics (+) Pregnancy                             Anesthesia Physical Anesthesia Plan  ASA: II  Anesthesia Plan: Epidural   Post-op Pain Management:    Induction:   PONV Risk Score and Plan:   Airway Management Planned:   Additional Equipment:   Intra-op Plan:   Post-operative Plan:   Informed Consent: I have reviewed the patients History and Physical, chart, labs and discussed the procedure including the risks, benefits and alternatives for the proposed anesthesia with the patient or authorized representative who has indicated his/her understanding and acceptance.     Dental Advisory Given  Plan Discussed with:   Anesthesia Plan Comments: (Labs checked- platelets confirmed with RN in room. Fetal heart tracing, per RN, reported to be stable enough for sitting procedure. Discussed epidural, and patient consents to the procedure:  included risk of possible headache,backache, failed block, allergic reaction, and nerve injury. This patient was asked  if she had any questions or concerns before the procedure started.)        Anesthesia Quick Evaluation

## 2020-02-08 NOTE — H&P (Signed)
OBSTETRIC ADMISSION HISTORY AND PHYSICAL  Nichole Hughes is a 34 y.o. female G2P1001 with IUP at [redacted]w[redacted]d by LMP presenting for painful contractions and onset of labor. She reports +FMs, No LOF, no VB, no blurry vision, headaches or peripheral edema, and RUQ pain.  She plans on breast feeding. She requests vasectomy for birth control. She received her prenatal care at [place]: patient's BG measurements from home ranged between 80s-90s with a few low 100s. Post prandials ranged from 106-150 at 5/14 visit. GTT testing 192/87/140. Diet controlled. Some asymptomatic hypoglycemic episodes noted.   Dating: By LMP --->  Estimated Date of Delivery: 02/18/20  Sono:    @[redacted]w[redacted]d , CWD, normal anatomy, cephalic presentation, posterior placenta, 2877g, 32% EFW   Prenatal History/Complications: GDMA1  Past Medical History: Past Medical History:  Diagnosis Date  . Headache   . Medical history non-contributory     Past Surgical History: Past Surgical History:  Procedure Laterality Date  . NO PAST SURGERIES      Obstetrical History: OB History    Gravida  2   Para  1   Term  1   Preterm      AB      Living  1     SAB      TAB      Ectopic      Multiple  0   Live Births  1           Social History: Social History   Socioeconomic History  . Marital status: Single    Spouse name: Not on file  . Number of children: 0  . Years of education: Not on file  . Highest education level: Not on file  Occupational History    Employer: [employer]     Comment: [place]  Tobacco Use  . Smoking status: Never Smoker  . Smokeless tobacco: Never Used  Substance and Sexual Activity  . Alcohol use: Not Currently    Comment: occasionally on weekends  . Drug use: No  . Sexual activity: Yes    Birth control/protection: None  Other Topics Concern  . Not on file  Social History Narrative  . Not on file   Social Determinants of Health   Financial Resource  Strain:   . Difficulty of Paying Living Expenses:   Food Insecurity: Food Insecurity Present  . Worried About Charity fundraiser in the Last Year: Sometimes true  . Ran Out of Food in the Last Year: Sometimes true  Transportation Needs: No Transportation Needs  . Lack of Transportation (Medical): No  . Lack of Transportation (Non-Medical): No  Physical Activity:   . Days of Exercise per Week:   . Minutes of Exercise per Session:   Stress:   . Feeling of Stress :   Social Connections:   . Frequency of Communication with Friends and Family:   . Frequency of Social Gatherings with Friends and Family:   . Attends Religious Services:   . Active Member of Clubs or Organizations:   . Attends Archivist Meetings:   Marland Kitchen Marital Status:     Family History: Family History  Problem Relation Age of Onset  . Diabetes Mother   . Healthy Father     Allergies: No Known Allergies  Medications Prior to Admission  Medication Sig Dispense Refill Last Dose  . Prenatal Vit-Fe Fumarate-FA (PRENATAL VITAMIN PO) Take by mouth.   02/08/2020 at Unknown time  . acetaminophen (TYLENOL) 500 MG tablet Take  500 mg by mouth every 6 (six) hours as needed.     . docusate sodium (COLACE) 100 MG capsule Take 1 capsule (100 mg total) by mouth 2 (two) times daily as needed. 30 capsule 2      Review of Systems   All systems reviewed and negative except as stated in HPI  Blood pressure 98/61, pulse 76, temperature 98.4 F (36.9 C), temperature source Oral, resp. rate 16, last menstrual period 05/31/2019, SpO2 98 %, unknown if currently breastfeeding. General appearance: alert, cooperative, appears stated age and moderate distress Lungs: normal effort Heart: regular rate  Abdomen: soft, non-tender; bowel sounds normal Pelvic: gravid uterus Extremities: Homans sign is negative, no sign of DVT Presentation: cephalic Fetal monitoringBaseline: 135 bpm, Variability: Good {> 6 bpm), Accelerations:  Reactive and Decelerations: Variable: mild Uterine activity: every 2 mins Dilation: 7 Effacement (%): 90 Station: -2 Exam by:: Dr. Morene Antu   Prenatal labs: ABO, Rh: --/--/O POS, O POS Performed at [facility], [address] [place], [place] [address]  [phone]/27 1856) Antibody: NEG (05/27 1856) Rubella: 7.30 (12/28 1236) RPR: Non Reactive (03/29 0859)  HBsAg: Negative (12/28 1236)  HIV: Non Reactive (03/29 0859)  GBS: Negative/-- (05/03 1625)  2 hr Glucola 192/87/140 Genetic screening: declined NIPS, normal AFP Anatomy US: normal   Prenatal Transfer Tool  Maternal Diabetes: Yes:  Diabetes Type:  Diet controlled Genetic Screening: Normal AFP, declined NIPS Maternal Ultrasounds/Referrals: Normal Fetal Ultrasounds or other Referrals:  None Maternal Substance Abuse:  No Significant Maternal Medications:  None Significant Maternal Lab Results: Group B Strep negative  Results for orders placed or performed during the hospital encounter of 02/08/20 (from the past 24 hour(s))  SARS Coronavirus 2 by RT PCR (hospital order, performed in [facility] hospital lab) Nasopharyngeal Nasopharyngeal Swab   Collection Time: 02/08/20  6:56 PM   Specimen: Nasopharyngeal Swab  Result Value Ref Range   SARS Coronavirus 2 NEGATIVE NEGATIVE  CBC   Collection Time: 02/08/20  6:56 PM  Result Value Ref Range   WBC 11.0 (H) 4.0 - 10.5 K/uL   RBC 4.13 3.87 - 5.11 MIL/uL   Hemoglobin 12.8 12.0 - 15.0 g/dL   HCT 23.5 57.3 - 22.0 %   MCV 95.4 80.0 - 100.0 fL   MCH 31.0 26.0 - 34.0 pg   MCHC 32.5 30.0 - 36.0 g/dL   RDW 25.4 27.0 - 62.3 %   Platelets 262 150 - 400 K/uL   nRBC 0.0 0.0 - 0.2 %  Comprehensive metabolic panel   Collection Time: 02/08/20  6:56 PM  Result Value Ref Range   Sodium 135 135 - 145 mmol/L   Potassium 4.2 3.5 - 5.1 mmol/L   Chloride 104 98 - 111 mmol/L   CO2 18 (L) 22 - 32 mmol/L   Glucose, Bld 83 70 - 99 mg/dL   BUN 9 6 - 20 mg/dL   Creatinine, Ser 7.62 0.44 - 1.00  mg/dL   Calcium 8.8 (L) 8.9 - 10.3 mg/dL   Total Protein 6.6 6.5 - 8.1 g/dL   Albumin 3.1 (L) 3.5 - 5.0 g/dL   AST 24 15 - 41 U/L   ALT 14 0 - 44 U/L   Alkaline Phosphatase 214 (H) 38 - 126 U/L   Total Bilirubin 1.0 0.3 - 1.2 mg/dL   GFR calc non Af Amer >60 >60 mL/min   GFR calc Af Amer >60 >60 mL/min   Anion gap 13 5 - 15  Type and screen  [facility]   Collection Time: 02/08/20  6:56 PM  Result Value Ref Range   ABO/RH(D) O POS    Antibody Screen NEG    Sample Expiration      02/11/2020,2359 Performed at [facility], [address], [place], [place] [address]   ABO/Rh   Collection Time: 02/08/20  6:56 PM  Result Value Ref Range   ABO/RH(D)      O POS Performed at [facility], [address] [place], [place] [address]     Patient Active Problem List   Diagnosis Date Noted  . Labor and delivery, indication for care 02/08/2020  . Gestational diabetes mellitus (GDM) in childbirth, diet controlled 12/12/2019  . Language barrier 09/04/2019  . Supervision of high-risk pregnancy 09/04/2019    Assessment/Plan:  Lamari Youngers is a 34 y.o. G2P1001 at [redacted]w[redacted]d here for SOL.   #Labor: expectant management, can consider starting pitocin as necessary  #Pain: Epidural per patient request #FWB: Category I #ID:  GBS Negative  #MOF: breast  #MOC: vasectomy  #Circ:  No #GDMA1: monitor CBG every 4 hours, last hemoglobin A1c 5.4 #Language Barrier: Spanish interpreter used for this encounter   TDap: completed   Nicki Guadalajara, MD Family Medicine, PGY-1 02/08/2020, 8:36 PM  I saw and evaluated the patient. I agree with the findings and the plan of care as documented in the resident's note. Cervix now 7/90/-2. AROM with clear fluid. Anticipate SVD. EFW 3300g.  Jerilynn Birkenhead, MD [facility] Family Medicine Fellow, [facility] for [employer], [facility]

## 2020-02-08 NOTE — Anesthesia Procedure Notes (Signed)
Epidural Patient location during procedure: OB Start time: 02/08/2020 7:25 PM End time: 02/08/2020 7:30 PM  Staffing Anesthesiologist: Bethena Midget, MD  Preanesthetic Checklist Completed: patient identified, IV checked, site marked, risks and benefits discussed, surgical consent, monitors and equipment checked, pre-op evaluation and timeout performed  Epidural Patient position: sitting Prep: DuraPrep and site prepped and draped Patient monitoring: continuous pulse ox and blood pressure Approach: midline Location: L3-L4 Injection technique: LOR air  Needle:  Needle type: Tuohy  Needle gauge: 17 G Needle length: 9 cm and 9 Needle insertion depth: 5 cm cm Catheter type: closed end flexible Catheter size: 19 Gauge Catheter at skin depth: 10 cm Test dose: negative  Assessment Events: blood not aspirated, injection not painful, no injection resistance, no paresthesia and negative IV test

## 2020-02-08 NOTE — MAU Note (Signed)
Patient c/o of contractions since 8 a.m, now 5 mins apart,  has bloody show. Denies LOF. +FM

## 2020-02-08 NOTE — Discharge Summary (Signed)
Postpartum Discharge Summary      Patient Name: Nichole Hughes DOB: 1986-08-06 MRN: [id number]  Date of admission: 02/08/2020 Delivery date:02/08/2020  Delivering provider: Chauncey Mann  Date of discharge: 02/10/2020  Admitting diagnosis: Labor and delivery, indication for care [O75.9] Intrauterine pregnancy: [redacted]w[redacted]d    Secondary diagnosis:  Active Problems:   Language barrier   Gestational diabetes mellitus (GDM) in childbirth, diet controlled   Labor and delivery, indication for care  Additional problems: None    Discharge diagnosis: Term Pregnancy Delivered and GDM A1                                              Post partum procedures:none Augmentation: AROM Complications: None  Hospital course: Onset of Labor With Vaginal Delivery      34y.o. yo G2P1001 at 334w4das admitted in Latent Labor on 02/08/2020. Patient had an uncomplicated labor course as follows: Initial SVE: 5/90/0. Received epidural. AROM performed at 7 cm. She then progressed to complete.  Membrane Rupture Time/Date: 8:33 PM ,02/08/2020   Delivery Method:Vaginal, Spontaneous  Episiotomy: None  Lacerations:  None  Patient had an uncomplicated postpartum course. Fasting AM glucose 68. She is ambulating, tolerating a regular diet, passing flatus, and urinating well. Patient is discharged home in stable condition on 02/10/20.  Newborn Data: Birth date:02/08/2020  Birth time:10:50 PM  Gender:Female  Living status:Living  Apgars:9 ,9  Weight:3096 g   Magnesium Sulfate received: No BMZ received: No Rhophylac:No MMR:No T-DaP:Given prenatally Flu: No Transfusion:No  Physical exam  Vitals:   02/09/20 1414 02/09/20 1602 02/09/20 2158 02/09/20 2314  BP: (!) 85/52 102/69 (!) 89/57 99/62  Pulse: 73 64 74 73  Resp:   16 16  Temp:   97.8 F (36.6 C)   TempSrc:   Oral   SpO2: 99% 98%     General: alert, cooperative and no distress Lochia: appropriate Uterine Fundus: firm Incision: N/A DVT  Evaluation: No evidence of DVT seen on physical exam. Labs: Lab Results  Component Value Date   WBC 11.0 (H) 02/08/2020   HGB 12.8 02/08/2020   HCT 39.4 02/08/2020   MCV 95.4 02/08/2020   PLT 262 02/08/2020   CMP Latest Ref Rng & Units 02/08/2020  Glucose 70 - 99 mg/dL 83  BUN 6 - 20 mg/dL 9  Creatinine 0.44 - 1.00 mg/dL 0.50  Sodium 135 - 145 mmol/L 135  Potassium 3.5 - 5.1 mmol/L 4.2  Chloride 98 - 111 mmol/L 104  CO2 22 - 32 mmol/L 18(L)  Calcium 8.9 - 10.3 mg/dL 8.8(L)  Total Protein 6.5 - 8.1 g/dL 6.6  Total Bilirubin 0.3 - 1.2 mg/dL 1.0  Alkaline Phos 38 - 126 U/L 214(H)  AST 15 - 41 U/L 24  ALT 0 - 44 U/L 14   Edinburgh Score: Edinburgh Postnatal Depression Scale Screening Tool 02/09/2020  I have been able to laugh and see the funny side of things. 0  I have looked forward with enjoyment to things. 0  I have blamed myself unnecessarily when things went wrong. 0  I have been anxious or worried for no good reason. 0  I have felt scared or panicky for no good reason. 2  Things have been getting on top of me. 1  I have been so unhappy that I have had difficulty sleeping. 0  I  have felt sad or miserable. 1  I have been so unhappy that I have been crying. 0  The thought of harming myself has occurred to me. 0  Edinburgh Postnatal Depression Scale Total 4     After visit meds:  Allergies as of 02/10/2020   No Known Allergies     Medication List    TAKE these medications   acetaminophen 500 MG tablet Commonly known as: TYLENOL Take 500 mg by mouth every 6 (six) hours as needed.   docusate sodium 100 MG capsule Commonly known as: COLACE Take 1 capsule (100 mg total) by mouth 2 (two) times daily as needed.   ibuprofen 600 MG tablet Commonly known as: ADVIL Take 1 tablet (600 mg total) by mouth every 8 (eight) hours as needed for mild pain.   PRENATAL VITAMIN PO Take by mouth.        Discharge home in stable condition Infant Feeding: Breast Infant  Disposition:home with mother Discharge instruction: per After Visit Summary and Postpartum booklet. Activity: Advance as tolerated. Pelvic rest for 6 weeks.  Diet: routine diet Future Appointments: Future Appointments  Date Time Provider [place]  02/13/2020  8:15 AM Chancy Milroy, MD [facility]   Follow up Visit:   Please schedule this patient for a In person postpartum visit in 4 weeks with the following provider: Any provider. Additional Postpartum F/U:2 hour GTT  High risk pregnancy complicated by: GDM Delivery mode:  Vaginal, Spontaneous  Anticipated Birth Control:  vasectomy   02/10/2020 Chauncey Mann, MD

## 2020-02-08 NOTE — MAU Note (Signed)
covid swab obtained without difficulty and pt tol well. No symptoms °

## 2020-02-09 ENCOUNTER — Encounter (HOSPITAL_COMMUNITY): Payer: Self-pay | Admitting: Obstetrics and Gynecology

## 2020-02-09 LAB — RPR: RPR Ser Ql: NONREACTIVE

## 2020-02-09 LAB — GLUCOSE, CAPILLARY
Glucose-Capillary: 105 mg/dL — ABNORMAL HIGH (ref 70–99)
Glucose-Capillary: 68 mg/dL — ABNORMAL LOW (ref 70–99)

## 2020-02-09 MED ORDER — COCONUT OIL OIL: 1.0000 "application " | TOPICAL_OIL | Status: DC | PRN

## 2020-02-09 MED ORDER — IBUPROFEN 600 MG PO TABS
600.0000 mg | ORAL_TABLET | Freq: Three times a day (TID) | ORAL | Status: DC | PRN
  Administered 2020-02-09 (×2): 600 mg via ORAL
  Filled 2020-02-09 (×3): qty 1

## 2020-02-09 MED ORDER — BENZOCAINE-MENTHOL 20-0.5 % EX AERO: 1.0000 "application " | INHALATION_SPRAY | CUTANEOUS | Status: DC | PRN

## 2020-02-09 MED ORDER — PRENATAL MULTIVITAMIN CH
1.0000 | ORAL_TABLET | Freq: Every day | ORAL | Status: DC
  Administered 2020-02-09: 1 via ORAL
  Filled 2020-02-09: qty 1

## 2020-02-09 MED ORDER — DIBUCAINE (PERIANAL) 1 % EX OINT: 1.0000 "application " | TOPICAL_OINTMENT | CUTANEOUS | Status: DC | PRN

## 2020-02-09 MED ORDER — TETANUS-DIPHTH-ACELL PERTUSSIS 5-2.5-18.5 LF-MCG/0.5 IM SUSP: 0.5000 mL | Freq: Once | INTRAMUSCULAR | Status: DC

## 2020-02-09 MED ORDER — ACETAMINOPHEN 325 MG PO TABS
650.0000 mg | ORAL_TABLET | Freq: Four times a day (QID) | ORAL | Status: DC | PRN
  Administered 2020-02-09: 650 mg via ORAL
  Filled 2020-02-09: qty 2

## 2020-02-09 MED ORDER — MEASLES, MUMPS & RUBELLA VAC IJ SOLR: 0.5000 mL | Freq: Once | INTRAMUSCULAR | Status: DC

## 2020-02-09 MED ORDER — SIMETHICONE 80 MG PO CHEW: 80.0000 mg | CHEWABLE_TABLET | ORAL | Status: DC | PRN

## 2020-02-09 MED ORDER — DIPHENHYDRAMINE HCL 25 MG PO CAPS: 25.0000 mg | ORAL_CAPSULE | Freq: Four times a day (QID) | ORAL | Status: DC | PRN

## 2020-02-09 MED ORDER — ONDANSETRON HCL 4 MG PO TABS: 4.0000 mg | ORAL_TABLET | ORAL | Status: DC | PRN

## 2020-02-09 MED ORDER — WITCH HAZEL-GLYCERIN EX PADS: 1.0000 "application " | MEDICATED_PAD | CUTANEOUS | Status: DC | PRN

## 2020-02-09 MED ORDER — SENNOSIDES-DOCUSATE SODIUM 8.6-50 MG PO TABS
2.0000 | ORAL_TABLET | ORAL | Status: DC
  Administered 2020-02-09 – 2020-02-10 (×2): 2 via ORAL
  Filled 2020-02-09 (×2): qty 2

## 2020-02-09 MED ORDER — ONDANSETRON HCL 4 MG/2ML IJ SOLN: 4.0000 mg | INTRAMUSCULAR | Status: DC | PRN

## 2020-02-09 NOTE — Anesthesia Postprocedure Evaluation (Signed)
Anesthesia Post Note  Patient: Nichole Hughes  Procedure(s) Performed: AN AD HOC LABOR EPIDURAL     Patient location during evaluation: Mother Baby Anesthesia Type: Epidural Level of consciousness: awake and alert Pain management: pain level controlled Vital Signs Assessment: post-procedure vital signs reviewed and stable Respiratory status: spontaneous breathing, nonlabored ventilation and respiratory function stable Cardiovascular status: stable Postop Assessment: no headache, no backache and epidural receding Anesthetic complications: no    Last Vitals:  Vitals:   02/09/20 1412 02/09/20 1414  BP: (!) 88/59 (!) 85/52  Pulse: 82 73  Resp: 16   Temp: 37 C   SpO2: 99% 99%    Last Pain:  Vitals:   02/09/20 1412  TempSrc: Oral  PainSc:    Pain Goal: Patients Stated Pain Goal: 2 (02/09/20 0519)                 [facility]ica Records

## 2020-02-09 NOTE — Progress Notes (Signed)
Post Partum Day 1 Subjective: Patient reports feeling well. She is tolerating PO. Lochia minimal. She has not yet ambulated to the bathroom as she is still numb from epidural.   Objective: Blood pressure 98/71, pulse 66, temperature 98.1 F (36.7 C), temperature source Oral, resp. rate 18, last menstrual period 05/31/2019, SpO2 100 %, unknown if currently breastfeeding.  Physical Exam:  General: alert, cooperative and appears stated age Lochia: appropriate Uterine Fundus: firm Incision: NA DVT Evaluation: No evidence of DVT seen on physical exam.  Recent Labs    02/08/20 1856  HGB 12.8  HCT 39.4    Assessment/Plan: Plan for discharge tomorrow  Breastfeeding Vitals stable Partner vasectomy for contraception   LOS: 1 day   [employer] 02/09/2020, 5:03 AM

## 2020-02-09 NOTE — Lactation Note (Addendum)
This note was copied from a baby's chart. Lactation Consultation Note  Patient Name: Nichole Hughes [id number] Date: 02/09/2020  G2P2 mom is an experienced breastfeeding mom.  Exclusively breastfed her first baby for one and a half years. Used interpreter on a stick.  Sue Lush, [id number].  [name] Nichole Hughes is an early term infant. Now 15 hours old. Mom reports she has no milk.  However mom reports RN did see some drops with hand expression. Mom reports he is not latching on to the breast as well as he latches on to the bottle.  Mom reports he had not had a pea yet and was dehydrated so she had to give him formula. Discussed with mom how they did not need much at first.  That there tummy was small and those drops was all he needed. And that he only was required to have 1 pea and 1 poop on his first day of life.  Mom voided understanding.  Asked mom if I could remove the formula from her room.  She said no to please leave the formula.  Urged mom to feed on cue and 8-12 or more times day.  Reviewed and left [facility]. Urged mom to call lactation as needed.         Maternal Data    Feeding Feeding Type: Bottle Fed - Formula  LATCH Score Latch: Grasps breast easily, tongue down, lips flanged, rhythmical sucking.  Audible Swallowing: A few with stimulation  Type of Nipple: Everted at rest and after stimulation  Comfort (Breast/Nipple): Soft / non-tender  Hold (Positioning): Assistance needed to correctly position infant at breast and maintain latch.  LATCH Score: 8  Interventions Interventions: Assisted with latch  Lactation Tools Discussed/Used     Consult Status      Nichole Hughes 02/09/2020, 2:10 PM

## 2020-02-10 ENCOUNTER — Ambulatory Visit: Payer: Self-pay

## 2020-02-10 MED ORDER — IBUPROFEN 600 MG PO TABS: 600.0000 mg | ORAL_TABLET | Freq: Three times a day (TID) | ORAL | 0 refills | Status: DC | PRN

## 2020-02-10 NOTE — Progress Notes (Addendum)
CSW was contacted by RN via telephone. RN stated MOB and infant were being discharge and MOB was requesting contact information for [place] Medicaid office. MOB reported she had complete a Medicaid application and was unsure of status. CSW provided MOB with contact information via interpreter assitance. MOB denied any other needs or concerns.   Nichole Hughes D. Blas Riches, MSW, LCSWA Clinical Social Worker [phone] 

## 2020-02-10 NOTE — Lactation Note (Signed)
This note was copied from a Nichole's chart. Lactation Consultation Note  Patient Name: Nichole Hughes [id number] Date: 02/10/2020   Nichole Hughes now 89 hours old and being discharged.  Used house interpreter [employer]. Mom reports her nipples and breasts are very sore. Both nipples intact but slightly reddened.   When infant cries his mouth doesn't open really wide and his tongue is not elevating.  Showed parents how to do jaw massage.Asked mom if we could work with breastfeeding.  Mom agreed.  Assisted with latching him on left breast in cross cradle.  Mom grimaces in pain. Asked her to wait a few seconds and she says it gets some better but still grimacing.  Assist /Broke suction, took him off.   Assist with 24 mm nipple shield.  Showed mom how to hand express and apply nipple shield.  Mom reports more comfort with the shield.  Mom started grimacing again.  Mom reports its her stomach now and not her nipples.  Infant Latched and breastfed well and fell asleep.  Mom broke the suction and colostrum seen in the nipple shield.   Mom tried to burp him and offer the right breast. Assisted mom in football hold on right breast.  Mom reports tolerable with 24 mm nipple shield.  mom was able to hand express and apply the nipple shield correctly prior to latching infant. Mom with cramping again while feeding on right breast.  Infant breastfed fifive more minbutes and fell asleep.  Colostrum again noted in shield.  Infant alseep.  Mom moved him to crib.  He woke up approximately 10 minutes later cuing.  Discussed waiting 10-15 minutes to move him to crib to let him be good and asleep and offering dessert if infanrt cuies past breastfeeding.  Urged to offer breast 8-12 or more times day.  Han expressa and rub expressed mothers milk on nipples and air dry.  Mom inquired about nipple creams.  Discussed her milk the best.  Parents report ghoing to [facility].  Breastfeeding referral sent.  Maternal Data     Feeding    LATCH Score                   Interventions    Lactation Tools Discussed/Used     Consult Status      Darrian Goodwill Michaelle Copas 02/10/2020, 3:38 PM

## 2020-02-13 ENCOUNTER — Encounter: Payer: Self-pay | Admitting: Obstetrics and Gynecology

## 2020-02-13 MED FILL — IBUPROFEN 600 MG TABLET: 600 | 10 days supply | Qty: 30 | Fill #0

## 2020-02-19 ENCOUNTER — Encounter: Payer: Self-pay | Admitting: Family Medicine

## 2020-03-19 ENCOUNTER — Other Ambulatory Visit: Payer: Self-pay | Admitting: Nurse Practitioner

## 2020-03-19 ENCOUNTER — Other Ambulatory Visit: Payer: Self-pay

## 2020-03-19 ENCOUNTER — Encounter: Payer: Self-pay | Admitting: Nurse Practitioner

## 2020-03-19 ENCOUNTER — Ambulatory Visit (INDEPENDENT_AMBULATORY_CARE_PROVIDER_SITE_OTHER): Payer: Self-pay | Admitting: Nurse Practitioner

## 2020-03-19 DIAGNOSIS — O24419 Gestational diabetes mellitus in pregnancy, unspecified control: Secondary | ICD-10-CM

## 2020-03-19 DIAGNOSIS — Z30011 Encounter for initial prescription of contraceptive pills: Secondary | ICD-10-CM

## 2020-03-19 MED ORDER — NORETHINDRONE 0.35 MG PO TABS
1.0000 | ORAL_TABLET | Freq: Every day | ORAL | 11 refills | Status: DC
Start: 2020-03-19 — End: 2020-03-19

## 2020-03-19 MED FILL — NORETHINDRONE 0.35 MG TABS: 0.35 | 28 days supply | Qty: 28 | Fill #0

## 2020-03-19 NOTE — Progress Notes (Signed)
    Post Partum Visit Note  Nichole Hughes is a 34 y.o. G5P2002 female who presents for a postpartum visit. She is 5 weeks postpartum following a normal spontaneous vaginal delivery.  I have fully reviewed the prenatal and intrapartum course. The delivery was at 38/4 gestational weeks.  Anesthesia: epidural. Postpartum course has been good.  Baby is doing well. Baby is feeding by breast. Bleeding no bleeding. Bowel function is normal. Bladder function is normal. Patient is not sexually active. Contraception method is none. Postpartum depression screening: negative.  Currently having soreness in vaginal area, groin and upper thighs. especially when getting out of bed.   The following portions of the patient's history were reviewed and updated as appropriate: allergies, current medications, past family history, past medical history, past social history, past surgical history and problem list.  Review of Systems Pertinent items noted in HPI and remainder of comprehensive ROS otherwise negative.    Objective:  Last menstrual period 05/31/2019, unknown if currently breastfeeding.  General:  alert, cooperative and no distress   Breasts:  not examined - breastfeeding  Lungs: clear to auscultation bilaterally  Heart:  regular rate and rhythm, S1, S2 normal, no murmur, click, rub or gallop  Abdomen: not examined  Pelvic deferred.  Thyroid normal.      Assessment:    Normal postpartum exam. Pap smear not done at today's visit.   Plan:   Essential components of care per ACOG recommendations:  1.  Mood and well being: Patient with negative depression screening today. Reviewed local resources for support.  - Patient does not use tobacco.  - hx of drug use? No   2. Infant care and feeding:  -Patient currently breastmilk feeding? Yes -Social determinants of health (SDOH) reviewed in EPIC. No concerns  3. Sexuality, contraception and birth spacing - Patient does not want a  pregnancy in the next year.  Desired family size is 2 children.  - Reviewed forms of contraception in tiered fashion. Patient desired partner desires vasectomy and is working with [employer] to get an appointment.  Client wants pills for now until he has his appointment. today.   - Discussed birth spacing of 18 months  4. Sleep and fatigue -Encouraged family/partner/community support of 4 hrs of uninterrupted sleep to help with mood and fatigue  5. Physical Recovery  - Discussed patients delivery and complications - Patient had no lacerations - Patient has urinary incontinence? No- Patient is safe to resume physical and sexual activity.  Discussed being on pills for 2 weeks before having unprotected intercourse.  6.  Health Maintenance - Last pap smear done March 2020 and was normal with negative HPV.  7.Sill need 2 hr glucola - had eaten before her appointment today. Will reschedule to come back for lab visit. Also having some groin ptain - reviewed exercises to strengthen core.  Advised to start slowly and progress if the exercises are not worsening her condition.  If these exercises are not helping, can consider referral to PT in the office.  Henrietta Dine, CMA Center for [employer], [facility]\  Nolene Bernheim, RN, MSN, NP-BC Nurse Practitioner, [facility] for [employer], [facility] 03/19/2020 8:57 AM

## 2020-03-19 NOTE — Patient Instructions (Addendum)
[email or web address] Ejercicios de Kegel Kegel Exercises  Los ejercicios de Kegel pueden ayudar a fortalecer los msculos del suelo plvico. El suelo plvico est formado por un grupo de msculos que sostienen el recto, el intestino delgado y la vejiga. En las mujeres, los msculos del suelo plvico tambin sostienen el tero. Estos msculos ayudan a controlar el flujo de [place] y materia fecal (heces). Los ejercicios de Kegel son indoloros y simples, y no requieren ningn equipo. El mdico puede sugerir ejercicios de Kegel para:  Mejorar el control de la vejiga y los intestinos.  Mejorar la respuesta sexual.  Mejorar los msculos dbiles del suelo plvico despus de una ciruga para extirpar el tero (histerectoma) o de un embarazo (mujeres).  Mejorar los msculos del suelo plvico dbiles despus de la extirpacin o ciruga de la prstata (hombres). Los ejercicios de Kegel consisten en contraer los msculos del suelo plvico, que son los mismos que contrae al intentar detener el flujo de la orina o evitar eliminar gases. Estos ejercicios se pueden Ecologist est sentado, parado o [place], pero lo mejor es variar la posicin. Ejercicios Cmo hacer los ejercicios de Kegel: 1. Contraiga con fuerza los msculos del suelo plvico. Debe sentir que el rea rectal se eleva y se tensa. Si es [place], tambin debe sentir tensin en el rea vaginal. Mantenga el [place], las nalgas y las piernas [place]. 2. Mantenga los msculos tensos durante 10segundos como mximo. 3. Respire con normalidad. 4. Relaje los msculos. 5. Repita como se lo haya indicado el mdico. Repita este ejercicio a diario como se lo haya indicado el mdico. Contine haciendo este ejercicio durante al menos 4 a 6semanas o el tiempo que le haya indicado su mdico. Pueden derivarlo a un fisioterapeuta que lo puede ayudar a aprender ms sobre cmo hacer los ejercicios de Kegel. Segn sea su estado, el mdico puede  recomendarle lo siguiente:  Variar cunto [employer].  Hacer varias series de ejercicios [employer].  Hacer ejercicios durante varias semanas.  Convertir los ejercicios de Kegel en parte de su rutina de ejercicios habitual. Esta informacin no tiene Theme park manager el consejo del mdico. Asegrese de hacerle al mdico cualquier pregunta que tenga. Document Revised: 06/01/2018 Document Reviewed: 06/01/2018 Elsevier Patient Education  2020 [employer].  Ejercicios para fortalecer los msculos del Educational psychologist Exercises  Los ejercicios para fortalecer los msculos del tronco ayudan a Environmental education officer los msculos que se encuentran entre las costillas y la cadera (msculos abdominales). Estos msculos ayudan a Manufacturing engineer cuerpo y Education officer, environmental columna vertebral. Es importante mantener el tronco fortalecido para Automotive engineer lesiones y Engineer, mining. Algunas actividades, como yoga y pilates, pueden ayudar a Chief Operating Officer los msculos del tronco. Tambin puede fortalecer los msculos del tronco haciendo ejercicios en su casa. Es importante que hable con el mdico antes de comenzar una nueva rutina de actividad fsica. Cules son los beneficios de los ejercicios para fortalecer los msculos del tronco? Los ejercicios para fortalecer los msculos del tronco pueden hacer lo siguiente:  Teacher, early years/pre de espalda.  Ayudar a recobrar fuerza despus de una lesin en la espalda o en la columna vertebral.  Ayudar a prevenir lesiones durante la actividad fsica, especialmente lesiones en la espalda y las rodillas. Cmo hacer los ejercicios para fortalecer los msculos del tronco Repita estos ejercicios de 10 a 15 veces o hasta que se canse. Haga los ejercicios exactamente como se lo haya indicado el mdico y gradelos como se lo hayan indicado.  Es normal sentir un estiramiento leve, tironeo, opresin o Dentist al [employer] ejercicios. Si siente dolor al [employer] ejercicios,  detngase. Si el dolor contina o empeora al [employer] ejercicios para fortalecer los msculos del tronco, comunquese con el mdico. Le aconsejamos utilizar una colchoneta acolchada para los ejercicios que se realizan en el piso. Puente  6. Recustese boca arriba sobre una superficie firme con las rodillas flexionadas y los pies completamente apoyados en el suelo. 7. Levante la cadera de modo que las rodillas, la cadera y los hombros formen una lnea recta. Mantenga los msculos abdominales contrados. 8. Mantenga esta posicin durante 3 a 5segundos. 9. Baje lentamente la cadera a la posicin inicial. 10. Relaje los msculos por completo entre repeticiones. Puente con una sola pierna 1. Recustese boca arriba sobre una superficie firme con las rodillas flexionadas y los pies completamente apoyados en el suelo. 2. Levante la cadera de modo que las rodillas, la cadera y los hombros formen una lnea recta. Mantenga los msculos abdominales contrados. 3. Levante un pie del suelo, luego enderece completamente esa pierna. 4. Mantenga esta posicin durante 3 a 5segundos. 5. Baje la pierna enderezada y vuelva a flexionarla. 6. Baje lentamente la cadera a la posicin inicial. 7. Repita estos pasos con la otra pierna. Puente lateral 1. Recustese de costado con las rodillas flexionadas. Apyese en el codo que est cerca del suelo. 2. Usando sus msculos abdominales y el codo que est apoyado en el suelo, levante el cuerpo del suelo. Levante la cadera de [employer] hombros, la cadera y el pie formen una lnea recta. 3. Mantenga esta posicin durante 10segundos. Mantenga la cabeza y el cuello levantados y lejos del hombro (en su posicin normal, neutral). Mantenga los msculos abdominales contrados. 4. Baje lentamente la cadera a la posicin inicial. 5. Repita e intente mantener esta posicin por ms tiempo, hasta llegar a mantenerla por 30 segundos. Abdominales 1. Acustese boca arriba sobre  una superficie firme. Doble las rodillas y [employer] pies planos sobre el piso. 2. Cruce los [employer]. 3. Sin doblar el cuello, baje levemente el mentn en direccin al pecho. 4. Contraiga los msculos abdominales mientras levanta el pecho lo suficiente como para Artist los omplatos del suelo. No contenga la respiracin. Puede hacer esto con elevaciones cortas o largas. 5. Vuelva lentamente a la posicin inicial. Superman en cuadrupedia o bird dog 1. Apyese [employer] y las rodillas, con las piernas paralelas a los hombros y los brazos debajo de los hombros. Mantenga la espalda recta. 2. Contraiga los msculos abdominales. 3. Levante una pierna del piso y endercela. Intente mantenerla paralela al piso. 4. Baje lentamente la pierna hasta la posicin inicial. 5. Levante un brazo del piso y endercelo. Intente mantenerlo paralelo al piso. 6. Baje lentamente el brazo hasta la posicin inicial. 7. Repita con el otro brazo y la otra pierna. Si es posible, intente levantar una pierna y un brazo al [employer], en los lados opuestos del cuerpo. Por ejemplo, levante el brazo izquierdo y Hotel manager. [place] 1. Acustese boca abajo. 2. Apoye el cuerpo [employer] y los pies, manteniendo las piernas rectas. El cuerpo debe formar una lnea recta [employer] hombros y los pies. 3. Sostenga esta posicin durante 10 segundos mientras mantiene los msculos abdominales contrados. 4. Baje el cuerpo hasta la posicin inicial. 5. Repita e intente mantener esta posicin por ms tiempo, hasta llegar a mantenerla por 30 segundos. Fortalecimiento cruzado  1. Prese con los pies separados al ancho de los hombros. 2. Rosario Jacks pelota frente a usted. Mantenga los brazos extendidos. 3. Contraiga los msculos abdominales y gire lentamente la cintura de un lado a otro. Mantenga los pies planos. 4. Cuando est en una posicin cmoda, trate de repetir este ejercicio con  una pelota ms pesada. Fortalecimiento de Hydrographic surveyor superior del tronco 1. Prese a una distancia aproximada de 18pulgadas (46cm) de la pared, con la espalda contra la pared. 2. Mantenga los pies planos y paralelos a los hombros. 3. Contraiga los msculos abdominales. 4. Doble la cadera y las rodillas. 5. Lentamente, intente tocar [employer] pared que se encuentra detrs de usted. 6. Lentamente, vuelva a incorporarse. 7. [employer] brazos sobre la cabeza e intente tocar la pared que se encuentra detrs de usted. 8. Regrese a la posicin inicial. Consejos generales  No haga ningn ejercicio que le cause dolor. Si siente dolor mientras hace ejercicio, hable con el mdico.  Siempre elongue antes y despus de hacer estos ejercicios. Esto puede ayudar a Company secretary.  Mantenga un peso saludable. Pregunte a su mdico cul es el peso saludable para usted. Comunquese con un mdico si:  Tiene un dolor en la espalda que empeora o no desaparece.  Siente dolor al hacer ejercicios para fortalecer los msculos del tronco. Solicite ayuda de inmediato si:  Tiene un dolor intenso que no mejora con medicamentos. Resumen  Los ejercicios para fortalecer los msculos del tronco ayudan a Chief Operating Officer los msculos que se encuentran entre las costillas y la cadera.  Los msculos del tronco ayudan a Manufacturing engineer cuerpo y Naval architect la columna vertebral.  Verner Chol actividades, como yoga y pilates, pueden ayudar a Chief Operating Officer los msculos del tronco.  [place] ejercicios para fortalecer los msculos del tronco pueden ayudar a Engineer, materials de espalda y a Agricultural engineer.  Si siente dolor al hacer ejercicios para fortalecer los msculos del tronco, [place]. Esta informacin no tiene Theme park manager el consejo del mdico. Asegrese de hacerle al mdico cualquier pregunta que tenga. Document Revised: 03/09/2017 Document Reviewed: 03/09/2017 Elsevier Patient Education  2020 [employer].

## 2020-03-19 NOTE — Progress Notes (Signed)
Pt did not fast this am. Will have to reschedule 2hr GTT. Regarding BC, PT states husband will be getting a Vasectomy but she still wants BC Pills .

## 2020-03-25 ENCOUNTER — Other Ambulatory Visit: Payer: Self-pay | Admitting: General Practice

## 2020-03-25 ENCOUNTER — Other Ambulatory Visit: Payer: Self-pay

## 2020-03-25 DIAGNOSIS — O24420 Gestational diabetes mellitus in childbirth, diet controlled: Secondary | ICD-10-CM

## 2020-03-26 LAB — HEPATITIS C ANTIBODY: Hep C Virus Ab: <0.1 s/co ratio (ref 0.0–0.9)

## 2020-03-26 LAB — GLUCOSE TOLERANCE, 2 HOURS
Glucose, 2 hour: 87 mg/dL (ref 65–139)
Glucose, GTT - Fasting: 72 mg/dL (ref 65–99)

## 2020-04-30 MED FILL — NORETHINDRONE 0.35 MG TABS: 0.35 | 84 days supply | Qty: 84 | Fill #1

## 2020-06-04 ENCOUNTER — Encounter (INDEPENDENT_AMBULATORY_CARE_PROVIDER_SITE_OTHER): Payer: Self-pay | Admitting: Primary Care

## 2020-06-04 ENCOUNTER — Telehealth (INDEPENDENT_AMBULATORY_CARE_PROVIDER_SITE_OTHER): Payer: Self-pay | Admitting: Primary Care

## 2020-06-04 ENCOUNTER — Other Ambulatory Visit: Payer: Self-pay

## 2020-06-04 DIAGNOSIS — N926 Irregular menstruation, unspecified: Secondary | ICD-10-CM

## 2020-06-04 DIAGNOSIS — R5383 Other fatigue: Secondary | ICD-10-CM

## 2020-06-04 NOTE — Progress Notes (Signed)
Telephone Note  I connected with Nichole Hughes on 06/04/20 at 10:50 AM EDT by telephone and verified that I am speaking with the correct person using two identifiers.  Patient is at home.    I discussed the limitations, risks, security and privacy concerns of performing an evaluation and management service by telephone and the availability of in person appointments. I also discussed with the patient that there may be a patient responsible charge related to this service. The patient expressed understanding and agreed to proceed. Kerin Perna, NP in office [facility]   History of Present Illness: [name] is 34 year old female Spanish speaking only has been spotting for about 2-3 weeks 'gave birth about 3 months ago. Than last month she had normal menstrual cycle.  Pt feels fatigued   Observations/Objective: Review of Systems  Genitourinary:       Irregular menstrual cycle/ breast feeding  All other systems reviewed and are negative.  Assessment and Plan: Nichole Hughes was seen today for menorrhagia and fatigue.  Diagnoses and all orders for this visit:  Fatigue, unspecified type -     Cancel: CBC with Differential/Platelet; Future -     CBC with Differential/Platelet  Irregular menstrual cycle Menstrual cycles can be irregular pack of birth and with breast-feeding a normal variant -     Cancel: CBC with Differential/Platelet; Future -     CBC with Differential/Platelet  Breast feeding status of mother Encourage increasing fluids healthy snacks and 3 meals a day to provide nutrition for her and the baby -     [id number]+EGFR    Follow Up Instructions:    I discussed the assessment and treatment plan with the patient. The patient was provided an opportunity to ask questions and all were answered. The patient agreed with the plan and demonstrated an understanding of the instructions.   The patient was advised to call back or seek an in-person evaluation  if the symptoms worsen or if the condition fails to improve as anticipated.  I provided 31mnutes of non-face-to-face time during this encounter.   MKerin Perna NP

## 2020-06-04 NOTE — Progress Notes (Signed)
Pt has been spotting for about 2-3 weeks 'gave birth about 3 months ago  Pt feels fatigued  [employer] [phone]

## 2020-06-05 LAB — CBC WITH DIFFERENTIAL/PLATELET
Basophils Absolute: 0 10*3/uL (ref 0.0–0.2)
Basos: 0 %
EOS (ABSOLUTE): 0.1 10*3/uL (ref 0.0–0.4)
Eos: 1 %
Hematocrit: 43.7 % (ref 34.0–46.6)
Hemoglobin: 14.5 g/dL (ref 11.1–15.9)
Immature Grans (Abs): 0 10*3/uL (ref 0.0–0.1)
Immature Granulocytes: 0 %
Lymphocytes Absolute: 2.3 10*3/uL (ref 0.7–3.1)
Lymphs: 34 %
MCH: 31.4 pg (ref 26.6–33.0)
MCHC: 33.2 g/dL (ref 31.5–35.7)
MCV: 95 fL (ref 79–97)
Monocytes Absolute: 0.5 10*3/uL (ref 0.1–0.9)
Monocytes: 7 %
Neutrophils Absolute: 4 10*3/uL (ref 1.4–7.0)
Neutrophils: 58 %
Platelets: 340 10*3/uL (ref 150–450)
RBC: 4.62 x10E6/uL (ref 3.77–5.28)
RDW: 13.4 % (ref 11.7–15.4)
WBC: 6.9 10*3/uL (ref 3.4–10.8)

## 2020-06-05 LAB — CMP14+EGFR
ALT: 22 IU/L (ref 0–32)
AST: 11 IU/L (ref 0–40)
Albumin/Globulin Ratio: 2.1 (ref 1.2–2.2)
Albumin: 5 g/dL — ABNORMAL HIGH (ref 3.8–4.8)
Alkaline Phosphatase: 98 IU/L (ref 44–121)
BUN/Creatinine Ratio: 21 (ref 9–23)
BUN: 12 mg/dL (ref 6–20)
Bilirubin Total: 0.5 mg/dL (ref 0.0–1.2)
CO2: 24 mmol/L (ref 20–29)
Calcium: 9.7 mg/dL (ref 8.7–10.2)
Chloride: 103 mmol/L (ref 96–106)
Creatinine, Ser: 0.58 mg/dL (ref 0.57–1.00)
GFR calc Af Amer: 140 mL/min/{1.73_m2} (ref >59)
GFR calc non Af Amer: 121 mL/min/{1.73_m2} (ref >59)
Globulin, Total: 2.4 g/dL (ref 1.5–4.5)
Glucose: 88 mg/dL (ref 65–99)
Potassium: 4.5 mmol/L (ref 3.5–5.2)
Sodium: 139 mmol/L (ref 134–144)
Total Protein: 7.4 g/dL (ref 6.0–8.5)

## 2020-07-01 ENCOUNTER — Telehealth: Payer: Self-pay | Admitting: Family Medicine

## 2020-07-01 NOTE — Telephone Encounter (Signed)
Patient is calling to schedule CAFA appt with Carolos Please advise CB- [phone]

## 2020-07-01 NOTE — Telephone Encounter (Signed)
Return pt call, schedule a financial appt for 07/11/20

## 2020-07-11 ENCOUNTER — Ambulatory Visit: Payer: Self-pay

## 2020-07-19 ENCOUNTER — Other Ambulatory Visit: Payer: Self-pay

## 2020-07-19 ENCOUNTER — Ambulatory Visit: Payer: Self-pay | Attending: Family Medicine

## 2020-07-19 MED FILL — NORETHINDRONE 0.35 MG TABS: 0.35 | 84 days supply | Qty: 84 | Fill #2

## 2020-07-24 ENCOUNTER — Other Ambulatory Visit: Payer: Self-pay

## 2020-07-24 ENCOUNTER — Encounter: Payer: Self-pay | Admitting: Family Medicine

## 2020-07-24 ENCOUNTER — Ambulatory Visit: Payer: Self-pay | Attending: Family Medicine | Admitting: Family Medicine

## 2020-07-24 DIAGNOSIS — M6740 Ganglion, unspecified site: Secondary | ICD-10-CM

## 2020-07-24 DIAGNOSIS — N393 Stress incontinence (female) (male): Secondary | ICD-10-CM

## 2020-07-24 NOTE — Progress Notes (Signed)
Has a knot on her wrist

## 2020-07-24 NOTE — Progress Notes (Signed)
Virtual Visit via Telephone Note  I connected with Nichole Hughes, on 07/24/2020 at 10:41 AM by telephone due to the COVID-19 pandemic and verified that I am speaking with the correct person using two identifiers.   Consent: I discussed the limitations, risks, security and privacy concerns of performing an evaluation and management service by telephone and the availability of in person appointments. I also discussed with the patient that there may be a patient responsible charge related to this service. The patient expressed understanding and agreed to proceed.   Location of Patient: Home  Location of Provider: Clinic   Persons participating in Telemedicine visit: Hensley Aziz ID [id number] Dr. Alvis Lemmings     History of Present Illness: Nichole Hughes is a 34 year old female 6 months post partum (s/p NSVD) seen with the following concerns.  She has had a knot on her L wrist for the last 5 months which is not painful with no preceding history of trauma and this has not changed in size.  She describes it as soft with no associated erythema or drainage. She has had stress incontinence since she had her baby in 01/2020. Everytime she sneezes or coughs, urine leaks. She also feels 'a ball ' in her vagina.  She informs me she feels the sutures which were placed after her childbirth were not done properly. She had a postpartum visit in 03/2020 but she informs me she was not examined at that visit.   Past Medical History:  Diagnosis Date  . Headache   . Medical history non-contributory    No Known Allergies  Current Outpatient Medications on File Prior to Visit  Medication Sig Dispense Refill  . norethindrone (MICRONOR) 0.35 MG tablet Take 1 tablet (0.35 mg total) by mouth daily. 28 tablet 11  . Prenatal Vit-Fe Fumarate-FA (PRENATAL VITAMIN PO) Take by mouth.     No current facility-administered medications on file prior to  visit.    Observations/Objective: Awake, alert, and 2x3 Not in acute distress  Assessment and Plan: 1. Stress incontinence From her history she describes what is suspicious for prolapse She will need an in person visit to be evaluated for this I have discussed with her Kegel exercises to be performed about 3 times a day Hold off on medication at this time given her age and the fact that she is breast-feeding  2. Ganglion cyst She has been reassured regarding this Will examine at her visit.   Follow Up Instructions: Return in about 1 month (around 08/23/2020) for For stress incontinence and evaluation of possible prolapse.    I discussed the assessment and treatment plan with the patient. The patient was provided an opportunity to ask questions and all were answered. The patient agreed with the plan and demonstrated an understanding of the instructions.   The patient was advised to call back or seek an in-person evaluation if the symptoms worsen or if the condition fails to improve as anticipated.     I provided 12 minutes total of non-face-to-face time during this encounter including median intraservice time, reviewing previous notes, investigations, ordering medications, medical decision making, coordinating care and patient verbalized understanding at the end of the visit.     Hoy Register, MD, FAAFP. [facility] and [facility] [place], [place] [phone]   07/24/2020, 10:41 AM

## 2020-08-29 ENCOUNTER — Encounter: Payer: Self-pay | Admitting: Family Medicine

## 2020-08-29 ENCOUNTER — Other Ambulatory Visit: Payer: Self-pay

## 2020-08-29 ENCOUNTER — Ambulatory Visit: Payer: Self-pay | Attending: Family Medicine | Admitting: Family Medicine

## 2020-08-29 VITALS — BP 115/76 | HR 76 | Temp 98.3°F | Ht 61.0 in | Wt 134.8 lb

## 2020-08-29 DIAGNOSIS — M6740 Ganglion, unspecified site: Secondary | ICD-10-CM

## 2020-08-29 DIAGNOSIS — N393 Stress incontinence (female) (male): Secondary | ICD-10-CM

## 2020-08-29 DIAGNOSIS — Z23 Encounter for immunization: Secondary | ICD-10-CM

## 2020-08-29 NOTE — Progress Notes (Signed)
States that she has incontinence since she has given birth.  Has knot on inner left wrist.

## 2020-08-29 NOTE — Patient Instructions (Signed)
Ejercicios de Kegel °Kegel Exercises ° °Los ejercicios de Kegel pueden ayudar a fortalecer los músculos del suelo pélvico. El suelo pélvico está formado por un grupo de músculos que sostienen el recto, el intestino delgado y la vejiga. En las mujeres, los músculos del suelo pélvico también sostienen el útero. Estos músculos ayudan a controlar el flujo de orina y materia fecal (heces). °Los ejercicios de Kegel son indoloros y simples, y no requieren ningún equipo. El médico puede sugerir ejercicios de Kegel para: °· Mejorar el control de la vejiga y los intestinos. °· Mejorar la respuesta sexual. °· Mejorar los músculos débiles del suelo pélvico después de una cirugía para extirpar el útero (histerectomía) o de un embarazo (mujeres). °· Mejorar los músculos del suelo pélvico débiles después de la extirpación o cirugía de la próstata (hombres). °Los ejercicios de Kegel consisten en contraer los músculos del suelo pélvico, que son los mismos que contrae al intentar detener el flujo de la orina o evitar eliminar gases. Estos ejercicios se pueden realizar mientras está sentado, parado o acostado, pero lo mejor es variar la posición. °Ejercicios °Cómo hacer los ejercicios de Kegel: °1. Contraiga con fuerza los músculos del suelo pélvico. Debe sentir que el área rectal se eleva y se tensa. Si es mujer, también debe sentir tensión en el área vaginal. Mantenga el estómago, las nalgas y las piernas relajadas. °2. Mantenga los músculos tensos durante 10 segundos como máximo. °3. Respire con normalidad. °4. Relaje los músculos. °5. Repita como se lo haya indicado el médico. °Repita este ejercicio a diario como se lo haya indicado el médico. Continúe haciendo este ejercicio durante al menos 4 a 6 semanas o el tiempo que le haya indicado su médico. °Pueden derivarlo a un fisioterapeuta que lo puede ayudar a aprender más sobre cómo hacer los ejercicios de Kegel. °Según sea su estado, el médico puede recomendarle lo  siguiente: °· Variar cuánto tiempo contrae los músculos. °· Hacer varias series de ejercicios todos los días. °· Hacer ejercicios durante varias semanas. °· Convertir los ejercicios de Kegel en parte de su rutina de ejercicios habitual. °Esta información no tiene como fin reemplazar el consejo del médico. Asegúrese de hacerle al médico cualquier pregunta que tenga. °Document Revised: 06/01/2018 Document Reviewed: 06/01/2018 °Elsevier Patient Education © 2020 Elsevier Inc. ° °

## 2020-08-29 NOTE — Progress Notes (Signed)
Subjective:  Patient ID: Nichole Hughes, female    DOB: December 16, 1985  Age: 34 y.o. MRN: [id number]  CC: Urinary Incontinence   HPI Nichole Hughes is a 34 year old female, 7 months postpartum (status post NSVD) who complains of stress incontinence after vaginal delivery.  Had also complained of feeling a bulge in her vagina at her last visit. She had her postpartum visit in 03/2020 but informs me she was not examined at that visit.  She was also diagnosed with a ganglion cyst of her wrist and was reassured at her last visit last month however she is still concerned about it today.  Past Medical History:  Diagnosis Date  . Headache   . Medical history non-contributory     Past Surgical History:  Procedure Laterality Date  . NO PAST SURGERIES      Family History  Problem Relation Age of Onset  . Diabetes Mother   . Healthy Father     No Known Allergies  Outpatient Medications Prior to Visit  Medication Sig Dispense Refill  . norethindrone (MICRONOR) 0.35 MG tablet Take 1 tablet (0.35 mg total) by mouth daily. 28 tablet 11  . Prenatal Vit-Fe Fumarate-FA (PRENATAL VITAMIN PO) Take by mouth. (Patient not taking: Reported on 08/29/2020)     No facility-administered medications prior to visit.     ROS Review of Systems  Constitutional: Negative for activity change, appetite change and fatigue.  HENT: Negative for congestion, sinus pressure and sore throat.   Eyes: Negative for visual disturbance.  Respiratory: Negative for cough, chest tightness, shortness of breath and wheezing.   Cardiovascular: Negative for chest pain and palpitations.  Gastrointestinal: Negative for abdominal distention, abdominal pain and constipation.  Endocrine: Negative for polydipsia.  Genitourinary: Negative for dysuria and frequency.  Musculoskeletal:       See HPI  Skin: Negative for rash.  Neurological: Negative for tremors, light-headedness and numbness.   Hematological: Does not bruise/bleed easily.  Psychiatric/Behavioral: Negative for agitation and behavioral problems.    Objective:  BP 115/76   Pulse 76   Temp 98.3 F (36.8 C) (Oral)   Ht 5\' 1"  (1.549 m)   Wt 134 lb 12.8 oz (61.1 kg)   SpO2 100%   BMI 25.47 kg/m   BP/Weight 08/29/2020 03/19/2020 02/10/2020  Systolic BP 115 96 94  Diastolic BP 76 65 54  Wt. (Lbs) 134.8 128.9 -  BMI 25.47 24.36 -      Physical Exam Constitutional:      Appearance: She is well-developed.  Neck:     Vascular: No JVD.  Cardiovascular:     Rate and Rhythm: Normal rate.     Heart sounds: Normal heart sounds. No murmur heard.   Pulmonary:     Effort: Pulmonary effort is normal.     Breath sounds: Normal breath sounds. No wheezing or rales.  Chest:     Chest wall: No tenderness.  Abdominal:     General: Bowel sounds are normal. There is no distension.     Palpations: Abdomen is soft. There is no mass.     Tenderness: There is no abdominal tenderness.  Genitourinary:    Comments: External genitalia, vagina, cervix, adnexa-normal No prolapse evident Musculoskeletal:        General: Normal range of motion.     Right lower leg: No edema.     Left lower leg: No edema.     Comments: Ganglion cyst on flexor aspect of left lateral wrist, not tender  Neurological:  Mental Status: She is alert and oriented to person, place, and time.  Psychiatric:        Mood and Affect: Mood normal.     CMP Latest Ref Rng & Units 06/04/2020 02/08/2020 11/16/2018  Glucose 65 - 99 mg/dL 88 83 91  BUN 6 - 20 mg/dL 12 9 10   Creatinine 0.57 - 1.00 mg/dL 1.47 8.29  Sodium 134 - 144 mmol/L 139 135 138  Potassium 3.5 - 5.2 mmol/L 4.5 4.2 4.4  Chloride 96 - 106 mmol/L 103 104 102  CO2 20 - 29 mmol/L 24 18(L) 22  Calcium 8.7 - 10.2 mg/dL 9.7 5.62) 9.4  Total Protein 6.0 - 8.5 g/dL 7.4 6.6 7.1  Total Bilirubin 0.0 - 1.2 mg/dL 0.5 1.0 0.5  Alkaline Phos 44 - 121 IU/L 98 214(H) 59  AST 0 - 40 IU/L 11 24  18   ALT 0 - 32 IU/L 22 14 21     Lipid Panel     Component Value Date/Time   CHOL 201 (H) 11/16/2018 0851   TRIG 155 (H) 11/16/2018 0851   HDL 46 11/16/2018 0851   CHOLHDL 4.4 11/16/2018 0851   CHOLHDL 2.8 10/31/2015 0921   VLDL 11 10/31/2015 0921   LDLCALC 124 (H) 11/16/2018 0851    CBC    Component Value Date/Time   WBC 6.9 06/04/2020 1112   WBC 11.0 (H) 02/08/2020 1856   RBC 4.62 06/04/2020 1112   RBC 4.13 02/08/2020 1856   HGB 14.5 06/04/2020 1112   HGB 13.3 10/14/2012 1045   HCT 43.7 06/04/2020 1112   HCT 39.8 10/14/2012 1045   PLT 340 06/04/2020 1112   MCV 95 06/04/2020 1112   MCV 94.6 10/14/2012 1045   MCH 31.4 06/04/2020 1112   MCH 31.0 02/08/2020 1856   MCHC 33.2 06/04/2020 1112   MCHC 32.5 02/08/2020 1856   RDW 13.4 06/04/2020 1112   RDW 12.0 10/14/2012 1045   LYMPHSABS 2.3 06/04/2020 1112   LYMPHSABS 2.8 10/14/2012 1045   MONOABS 370 05/08/2016 1026   MONOABS 0.6 10/14/2012 1045   EOSABS 0.1 06/04/2020 1112   BASOSABS 0.0 06/04/2020 1112   BASOSABS 0.0 10/14/2012 1045    Lab Results  Component Value Date   HGBA1C 5.4 09/11/2019    Assessment & Plan:  1. Stress incontinence Advised to perform Kegel exercises She is currently nursing hence I will hold off on oxybutynin If symptoms persist consider oxybutynin She has also been advised if she does feel she has a prolapse I am happy to refer her to GYN even though this was not demonstrated during my exam  2. Ganglion cyst Reassured  3. Need for immunization against influenza - Flu Vaccine QUAD 36+ mos IM    No orders of the defined types were placed in this encounter.   Follow-up: Return in about 3 months (around 11/27/2020) for medical conditions.       10/16/2012, MD, FAAFP. [facility] and Wellness [place], Hoy Register [facility]   08/29/2020, 12:40 PM

## 2020-10-09 MED FILL — NORETHINDRONE 0.35 MG TABS: 0.35 | 84 days supply | Qty: 84 | Fill #3

## 2020-11-27 ENCOUNTER — Ambulatory Visit: Payer: Self-pay | Admitting: Family Medicine

## 2020-12-31 ENCOUNTER — Other Ambulatory Visit: Payer: Self-pay

## 2020-12-31 MED FILL — Norethindrone Tab 0.35 MG: ORAL | 56 days supply | Qty: 56 | Fill #0 | Status: AC

## 2020-12-31 MED FILL — Norethindrone Tab 0.35 MG: ORAL | 28 days supply | Qty: 28 | Fill #0 | Status: CN

## 2021-02-26 ENCOUNTER — Telehealth: Payer: Self-pay | Admitting: Family Medicine

## 2021-02-26 NOTE — Telephone Encounter (Signed)
I return Pt call, LVM to call back and also to schedule a provider appt

## 2021-02-26 NOTE — Telephone Encounter (Signed)
Copied from CRM [id number]. Topic: General - Inquiry >> Feb 17, 2021 10:19 AM Louie Bun, Rosey Bath D wrote: Reason for CRM: Patient is calling because she would like assistant with finance and would like a call back, thanks.

## 2021-03-05 ENCOUNTER — Ambulatory Visit: Payer: Self-pay

## 2021-03-19 ENCOUNTER — Other Ambulatory Visit: Payer: Self-pay

## 2021-03-19 ENCOUNTER — Ambulatory Visit: Payer: Self-pay | Attending: Family Medicine

## 2021-04-09 ENCOUNTER — Encounter: Payer: Self-pay | Admitting: Family

## 2021-04-09 ENCOUNTER — Other Ambulatory Visit: Payer: Self-pay

## 2021-04-09 ENCOUNTER — Ambulatory Visit: Payer: Self-pay | Attending: Physician Assistant | Admitting: Family

## 2021-04-09 VITALS — BP 114/77 | HR 67 | Resp 16 | Wt 133.4 lb

## 2021-04-09 DIAGNOSIS — Z3202 Encounter for pregnancy test, result negative: Secondary | ICD-10-CM

## 2021-04-09 DIAGNOSIS — Z0000 Encounter for general adult medical examination without abnormal findings: Secondary | ICD-10-CM

## 2021-04-09 LAB — POCT URINE PREGNANCY: Preg Test, Ur: NEGATIVE

## 2021-04-09 NOTE — Progress Notes (Signed)
Nichole Hughes, is a 35 y.o. female  IWO:[id number]  NOI:[id number]  DOB - 03-Dec-1985  Subjective:  Chief Complaint and HPI: Nichole Hughes is a 35 y.o. female who presents to the clinic this morning for yearly physical exam as well as pregnancy test.  Patient does not have any medical condition history denies any chest pain shortness of breath fever chills or any other concerns today.  2 to 3 weeks ago she experienced pain to the left breast and nipple was red resolved now, patient is breast-feeding her 4-year-old.  Patient reports family history of diabetes on her mother side denies any high blood pressure cancer or high cholesterol.  Pregnancy test is negative in the office today.  Patient does not have any concerns.  ED/Hospital notes reviewed.   Social History: Currently unemployed lives with her boyfriend and their 2 children, does not smoke does not use alcohol.  No usage of illicit drugs. Family history: Mother is deceased had history of diabetes mellitus type 2 other family history.  ROS:   Constitutional:  No f/c, No night sweats, No unexplained weight loss. EENT:  No vision changes, No blurry vision, No hearing changes. No mouth, throat, or ear problems.  Respiratory: No cough, No SOB Cardiac: No CP, no palpitations GI:  No abd pain, No N/V/D. GU: No Urinary s/sx Musculoskeletal: No joint pain Neuro: No headache, no dizziness, no motor weakness.  Skin: No rash Endocrine:  No polydipsia. No polyuria.  Psych: Denies SI/HI  No problems updated.  ALLERGIES: No Known Allergies  PAST MEDICAL HISTORY: Past Medical History:  Diagnosis Date   Headache    Medical history non-contributory     MEDICATIONS AT HOME: Prior to Admission medications   Medication Sig Start Date End Date Taking? Authorizing Provider  norethindrone (MICRONOR) 0.35 MG tablet TAKE 1 TABLET (0.35 MG TOTAL) BY MOUTH DAILY. 03/19/20 03/19/21  Virginia Rochester, NP  Prenatal Vit-Fe  Fumarate-FA (PRENATAL VITAMIN PO) Take by mouth. Patient not taking: Reported on 08/29/2020    [provider]     Objective:  EXAM:   Vitals:   04/09/21 0849  BP: 114/77  Pulse: 67  Resp: 16  SpO2: 97%  Weight: 133 lb 6.4 oz (60.5 kg)    General appearance : A&OX3. NAD. Non-toxic-appearing HEENT: Atraumatic and Normocephalic.  PERRLA. EOM intact.  TM clear B. Mouth-MMM, post pharynx WNL w/o erythema, No PND. Neck: supple, no JVD. No cervical lymphadenopathy. No thyromegaly Chest/Lungs:  Breathing-non-labored, Good air entry bilaterally, breath sounds normal without rales, rhonchi, or wheezing  CVS: S1 S2 regular, no murmurs, gallops, rubs  Abdomen: Bowel sounds present, Non tender and not distended with no gaurding, rigidity or rebound. GU: Deferred. Extremities: Bilateral Lower Ext shows no edema, both legs are warm to touch with = pulse throughout Neurology:  CN II-XII grossly intact, Non focal.   Psych:  TP linear. J/I WNL. Normal speech. Appropriate eye contact and affect.  Skin:  No Rash  Data Review Lab Results  Component Value Date   HGBA1C 5.4 09/11/2019     Assessment & Plan   1. Physical exam - Lipid Panel - CMP14+EGFR - CBC with Differential/Platelet - Hemoglobin A1c - TSH - T4, Free - Vitamin D, 25-hydroxy  2. Pregnancy test negative - POCT urine pregnancy -Educated on importance of women's health visits and encouraged to make an appointment.    Patient have been counseled extensively about nutrition and exercise  No follow-ups on file.  The patient was given  clear instructions to go to ER or return to medical center if symptoms don't improve, worsen or new problems develop. The patient verbalized understanding. The patient was told to call to get lab results if they haven't heard anything in the next week.     Feliberto Gottron, APRN, FNP-C [facility] [place], [place]   04/09/2021,  9:21 AM

## 2021-04-10 ENCOUNTER — Other Ambulatory Visit: Payer: Self-pay

## 2021-04-10 ENCOUNTER — Other Ambulatory Visit: Payer: Self-pay | Admitting: Family

## 2021-04-10 DIAGNOSIS — E559 Vitamin D deficiency, unspecified: Secondary | ICD-10-CM

## 2021-04-10 DIAGNOSIS — E785 Hyperlipidemia, unspecified: Secondary | ICD-10-CM

## 2021-04-10 LAB — CMP14+EGFR
ALT: 14 IU/L (ref 0–32)
AST: 14 IU/L (ref 0–40)
Albumin/Globulin Ratio: 2 (ref 1.2–2.2)
Albumin: 4.7 g/dL (ref 3.8–4.8)
Alkaline Phosphatase: 81 IU/L (ref 44–121)
BUN/Creatinine Ratio: 15 (ref 9–23)
BUN: 9 mg/dL (ref 6–20)
Bilirubin Total: 0.2 mg/dL (ref 0.0–1.2)
CO2: 21 mmol/L (ref 20–29)
Calcium: 9.3 mg/dL (ref 8.7–10.2)
Chloride: 103 mmol/L (ref 96–106)
Creatinine, Ser: 0.59 mg/dL (ref 0.57–1.00)
Globulin, Total: 2.4 g/dL (ref 1.5–4.5)
Glucose: 99 mg/dL (ref 65–99)
Potassium: 4.4 mmol/L (ref 3.5–5.2)
Sodium: 137 mmol/L (ref 134–144)
Total Protein: 7.1 g/dL (ref 6.0–8.5)
eGFR: 121 mL/min/{1.73_m2} (ref >59)

## 2021-04-10 LAB — VITAMIN D 25 HYDROXY (VIT D DEFICIENCY, FRACTURES): Vit D, 25-Hydroxy: 26.2 ng/mL — ABNORMAL LOW (ref 30.0–100.0)

## 2021-04-10 LAB — LIPID PANEL
Chol/HDL Ratio: 4.7 ratio — ABNORMAL HIGH (ref 0.0–4.4)
Cholesterol, Total: 230 mg/dL — ABNORMAL HIGH (ref 100–199)
HDL: 49 mg/dL (ref >39)
LDL Chol Calc (NIH): 166 mg/dL — ABNORMAL HIGH (ref 0–99)
Triglycerides: 85 mg/dL (ref 0–149)
VLDL Cholesterol Cal: 15 mg/dL (ref 5–40)

## 2021-04-10 LAB — CBC WITH DIFFERENTIAL/PLATELET
Basophils Absolute: 0 10*3/uL (ref 0.0–0.2)
Basos: 0 %
EOS (ABSOLUTE): 0.1 10*3/uL (ref 0.0–0.4)
Eos: 1 %
Hematocrit: 40.3 % (ref 34.0–46.6)
Hemoglobin: 13.4 g/dL (ref 11.1–15.9)
Immature Grans (Abs): 0 10*3/uL (ref 0.0–0.1)
Immature Granulocytes: 0 %
Lymphocytes Absolute: 2.4 10*3/uL (ref 0.7–3.1)
Lymphs: 32 %
MCH: 30.9 pg (ref 26.6–33.0)
MCHC: 33.3 g/dL (ref 31.5–35.7)
MCV: 93 fL (ref 79–97)
Monocytes Absolute: 0.6 10*3/uL (ref 0.1–0.9)
Monocytes: 8 %
Neutrophils Absolute: 4.3 10*3/uL (ref 1.4–7.0)
Neutrophils: 59 %
Platelets: 361 10*3/uL (ref 150–450)
RBC: 4.33 x10E6/uL (ref 3.77–5.28)
RDW: 11.7 % (ref 11.7–15.4)
WBC: 7.5 10*3/uL (ref 3.4–10.8)

## 2021-04-10 LAB — T4, FREE: Free T4: 1.15 ng/dL (ref 0.82–1.77)

## 2021-04-10 LAB — TSH: TSH: 1.73 u[IU]/mL (ref 0.450–4.500)

## 2021-04-10 LAB — HEMOGLOBIN A1C
Est. average glucose Bld gHb Est-mCnc: 123 mg/dL
Hgb A1c MFr Bld: 5.9 % — ABNORMAL HIGH (ref 4.8–5.6)

## 2021-04-10 MED ORDER — VITAMIN D3 25 MCG (1000 UT) PO CAPS
1000.0000 [IU] | ORAL_CAPSULE | Freq: Every day | ORAL | 2 refills | Status: AC
  Filled 2021-04-10: qty 30, 30d supply, fill #0

## 2021-04-24 ENCOUNTER — Other Ambulatory Visit: Payer: Self-pay

## 2021-06-03 IMAGING — US US MFM OB DETAIL+14 WK
1 series · 13 of 28 positions shown · non-contrast
Comparison: none

[Series 1: us mfm ob detail+14 wk · 13 of 55 slices shown]
[im 3/55]
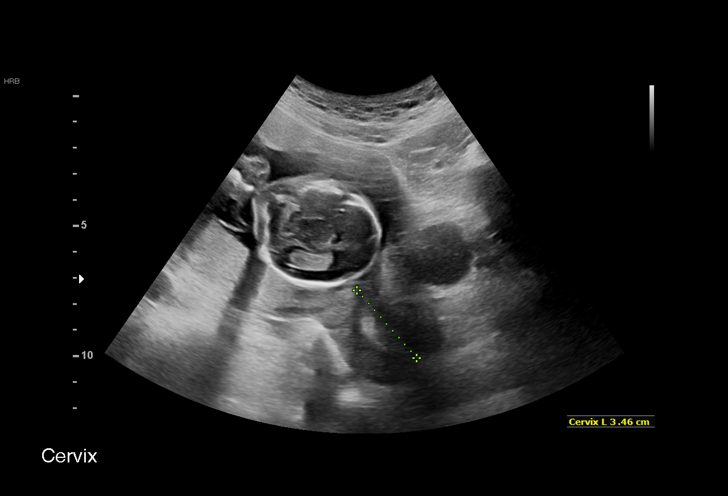
[im 7/55]
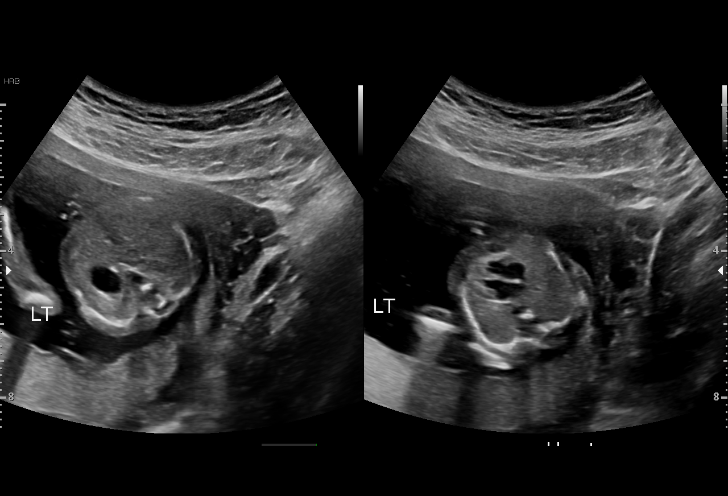
[im 11/55]
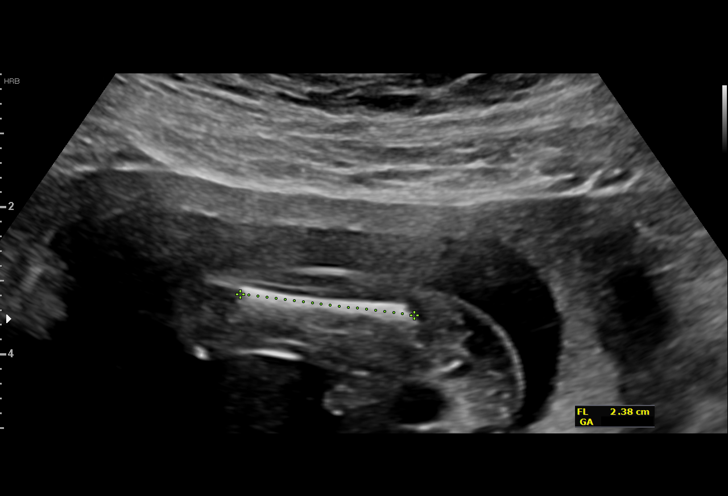
[im 15/55]
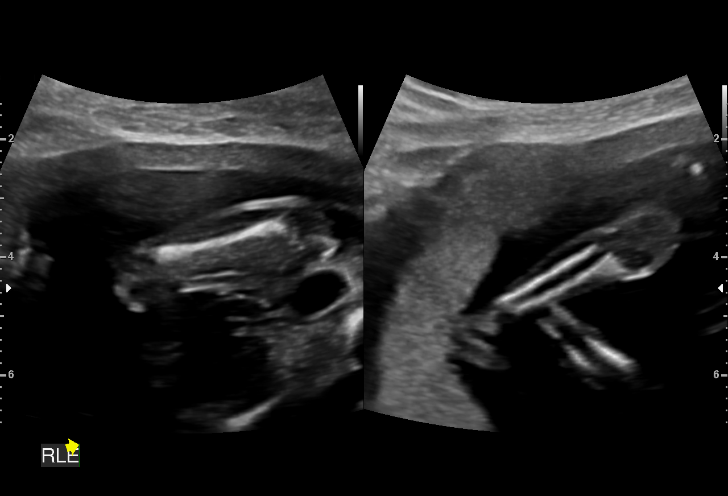
[im 19/55]
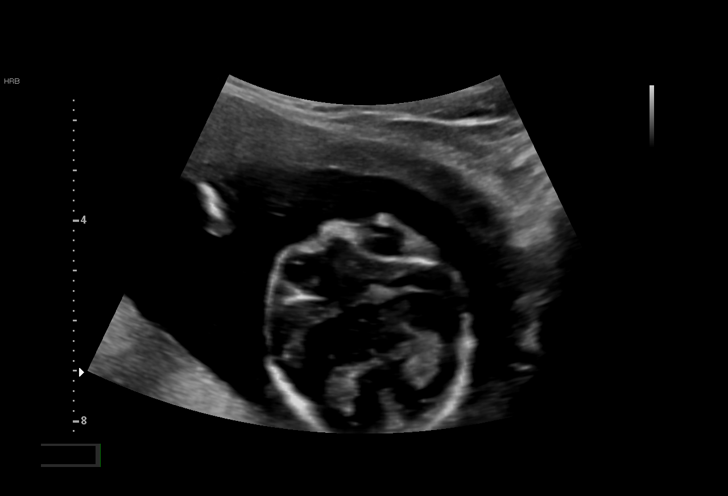
[im 23/55]
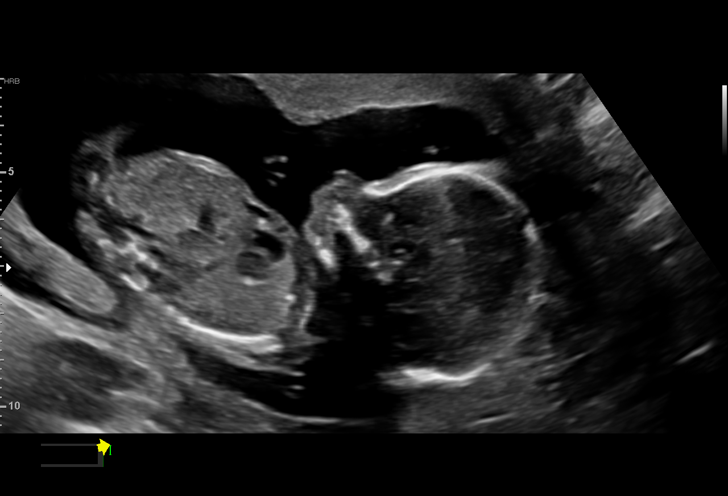
[im 29/55]
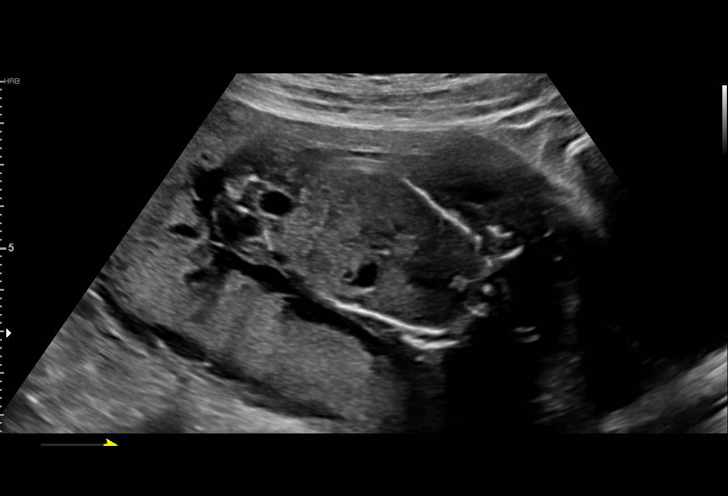
[im 33/55]
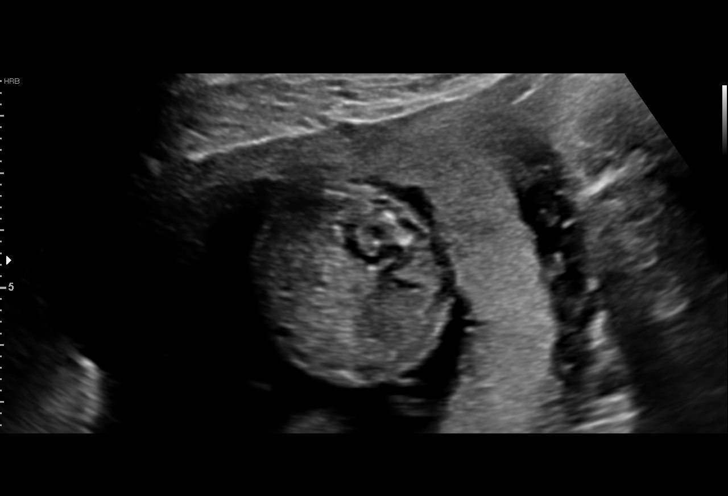
[im 37/55]
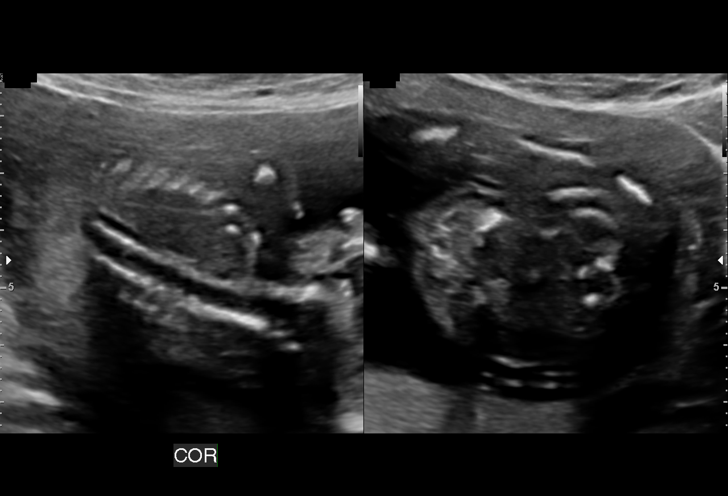
[im 41/55]
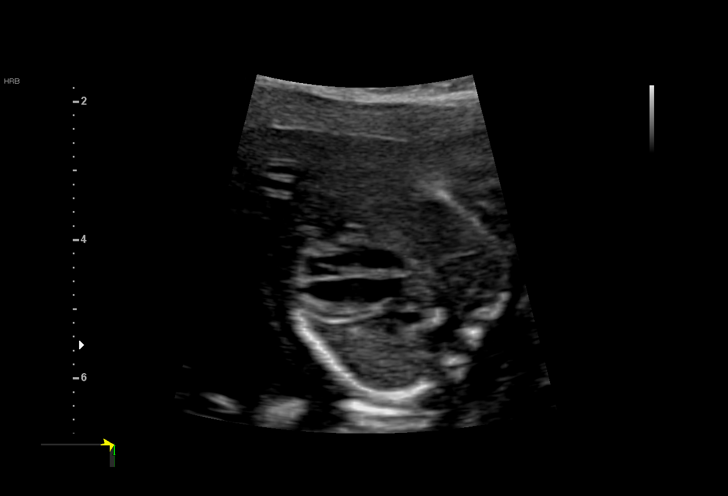
[im 45/55]
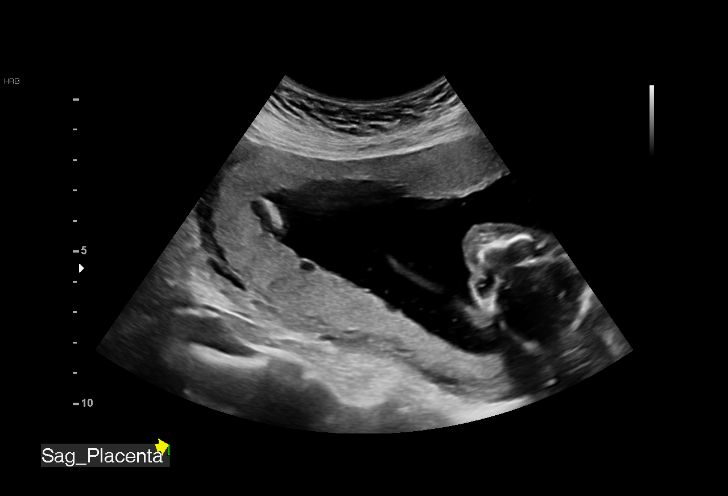
[im 49/55]
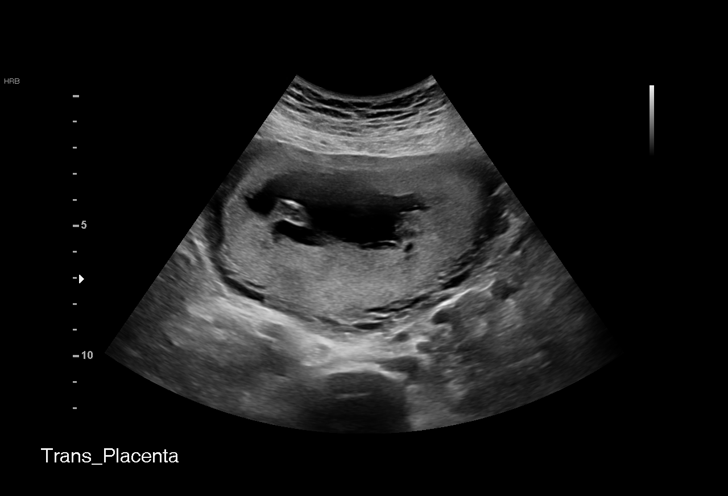
[im 53/55]
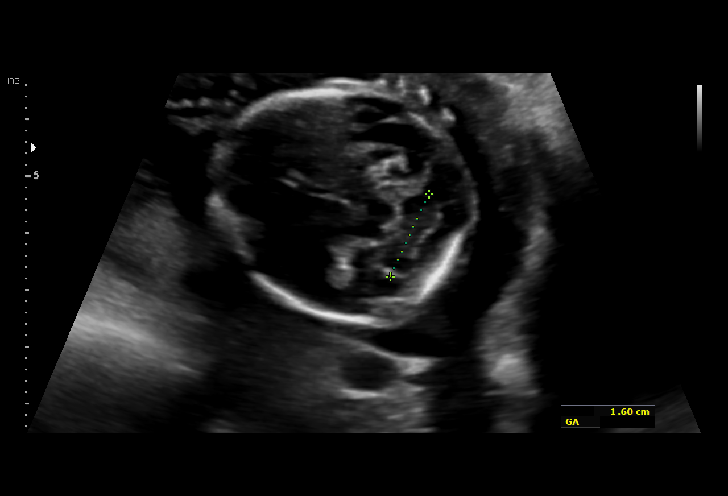

[13 of 28 positions shown; findings below may reference images not displayed]

[facility]

                                                            [address]

 ----------------------------------------------------------------------

 ----------------------------------------------------------------------
Indications

  Encounter for antenatal screening for
  malformations
  Fetal abnormality - other known or
  suspected (CPC)
  17 weeks gestation of pregnancy
 ----------------------------------------------------------------------
Vital Signs

 BMI:
Fetal Evaluation

 Num Of Fetuses:         1
 Fetal Heart Rate(bpm):  142
 Cardiac Activity:       Observed
 Presentation:           Cephalic
 Placenta:               Posterior
 P. Cord Insertion:      Visualized, central

 Amniotic Fluid
 AFI FV:      Within normal limits

                             Largest Pocket(cm)

Biometry

 BPD:      38.2  mm     G. Age:  17w 5d         67  %    CI:        81.46   %    70 - 86
                                                         FL/HC:      16.9   %    14.6 -
 HC:      133.6  mm     G. Age:  16w 6d         21  %    HC/AC:      1.13        1.07 -
 AC:      117.8  mm     G. Age:  17w 4d         57  %    FL/BPD:     59.2   %
 FL:       22.6  mm     G. Age:  16w 5d         26  %    FL/AC:      19.2   %    20 - 24
 CER:        16  mm     G. Age:  16w 0d         21  %
 NFT:       3.2  mm

 Est. FW:     183  gm      0 lb 6 oz     35  %
OB History

 Gravidity:    2         Term:   1
 Living:       1
Gestational Age

 U/S Today:     17w 2d                                        EDD:   02/18/20
 Best:          17w 2d     Det. By:  U/S (09/12/19)           EDD:   02/18/20
Anatomy

 Cranium:               Appears normal         Aortic Arch:            Appears normal
 Cavum:                 Not well visualized    Ductal Arch:            Appears normal
 Ventricles:            Not well visualized    Diaphragm:              Appears normal
 Choroid Plexus:        Appears normal         Stomach:                Appears normal, left
                                                                       sided
 Cerebellum:            Appears normal         Abdomen:                Appears normal
 Posterior Fossa:       Not well visualized    Abdominal Wall:         Appears nml (cord
                                                                       insert, abd wall)
 Nuchal Fold:           Not well visualized    Cord Vessels:           Appears normal (3
                                                                       vessel cord)
 Face:                  Not well visualized    Kidneys:                Appear normal
 Lips:                  Not well visualized    Bladder:                Appears normal
 Thoracic:              Appears normal         Spine:                  Appears normal
 Heart:                 Not well visualized    Upper Extremities:      Appears normal
 RVOT:                  Appears normal         Lower Extremities:      Appears normal
 LVOT:                  Appears normal

 Other:  Male gender. Technically difficult due to early gestational age.
Cervix Uterus Adnexa

 Cervix
 Length:           3.46  cm.
 Normal appearance by transabdominal scan.

 Uterus
 No abnormality visualized.
Impression

 Patient is here for pregnancy dating.  Obstetric history
 significant for previous term vaginal delivery.  She is unsure
 of her LMP date.
 On ultrasound fetal biometry is consistent with 17 weeks 2
 days gestation.  Amniotic fluid is normal and good fetal
 activity seen.  No markers of aneuploidies or fetal structural
 defects are seen.  Fetal anatomical survey is somewhat
 limited because of early gestational age.
 I explained the findings with the help of Spanish language
 interpreter present in the room.  Patient does not want
 screening for fetal aneuploidies.
 We have assigned [name] at 02/18/2020.
Recommendations

 -An appointment was made for her to return in 4 weeks for
 completion of fetal anatomy.
                 Dustin, Oliver

## 2021-06-06 ENCOUNTER — Other Ambulatory Visit (HOSPITAL_COMMUNITY): Payer: Self-pay

## 2021-07-02 IMAGING — US US MFM OB FOLLOW-UP
1 series · 14 of 28 positions shown · non-contrast
Comparison: none

[Series 1: us mfm ob follow-up · 14 of 53 slices shown]
[im 2/53]
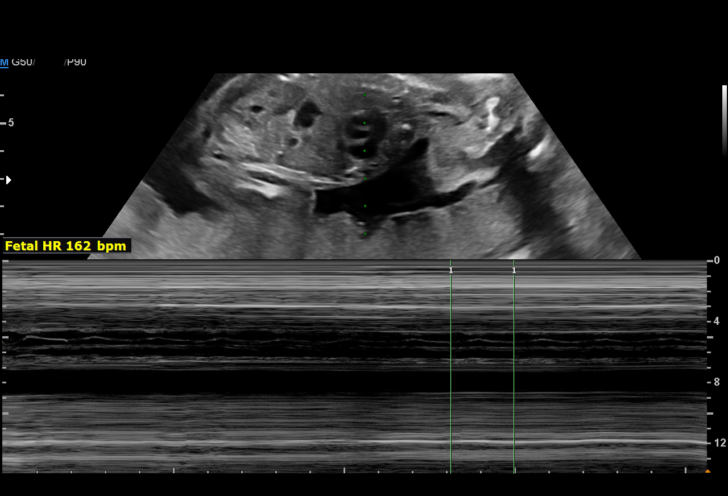
[im 6/53]
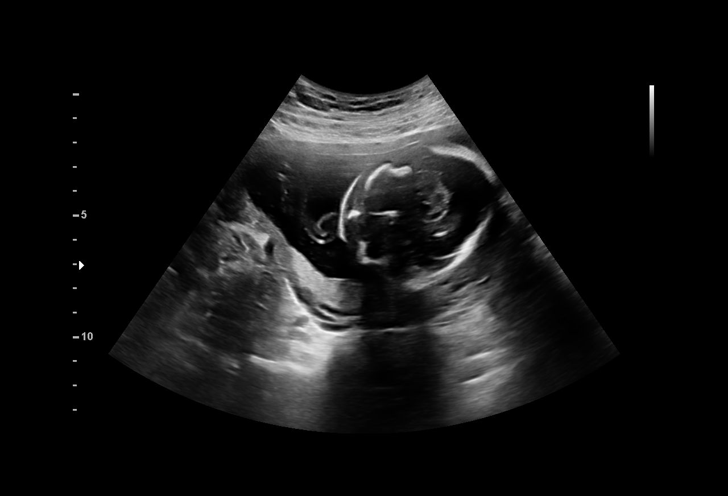
[im 10/53]
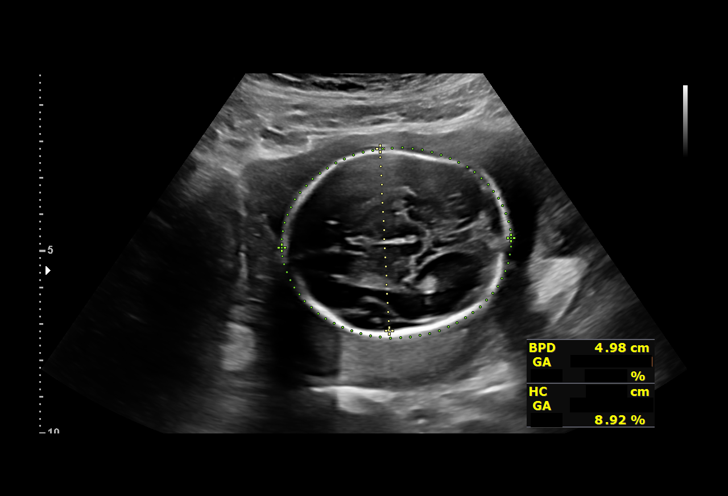
[im 14/53]
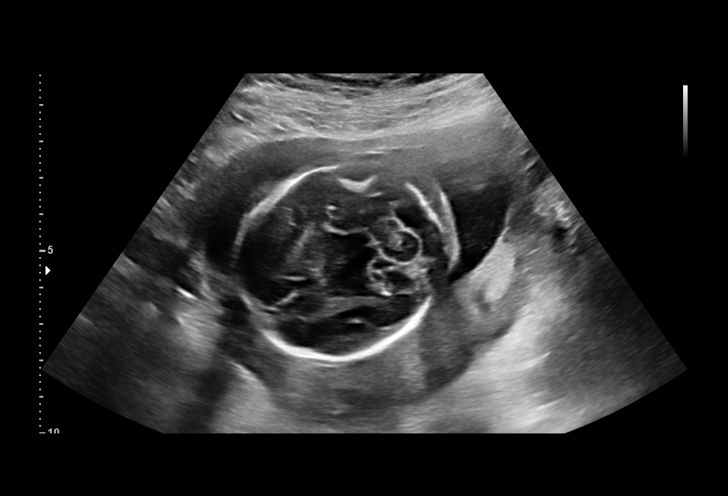
[im 18/53]
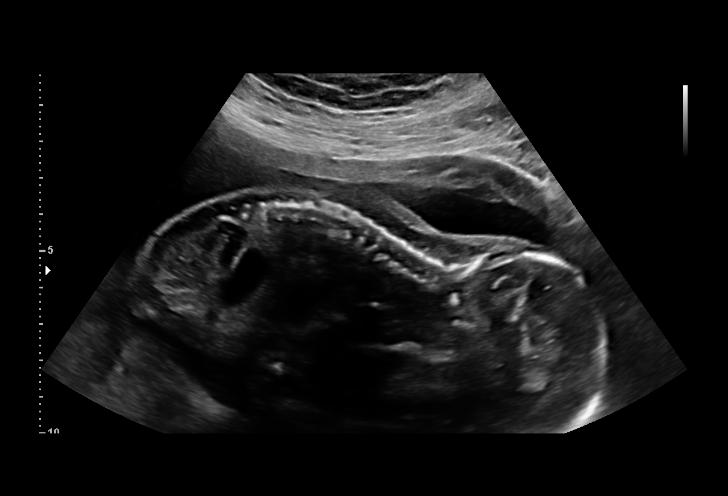
[im 22/53]
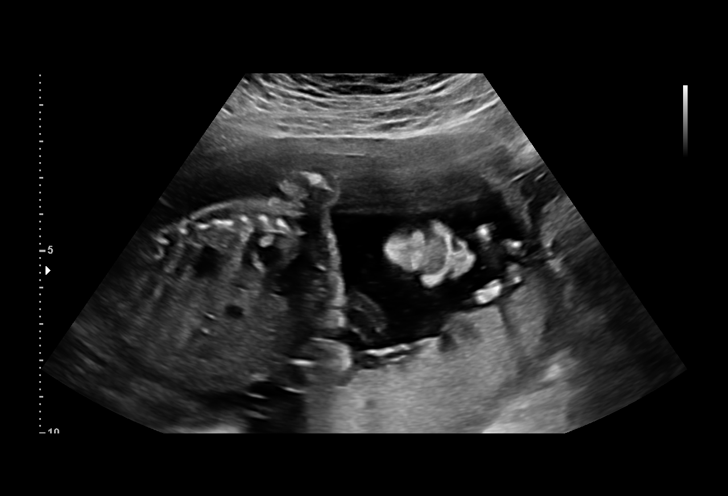
[im 26/53]
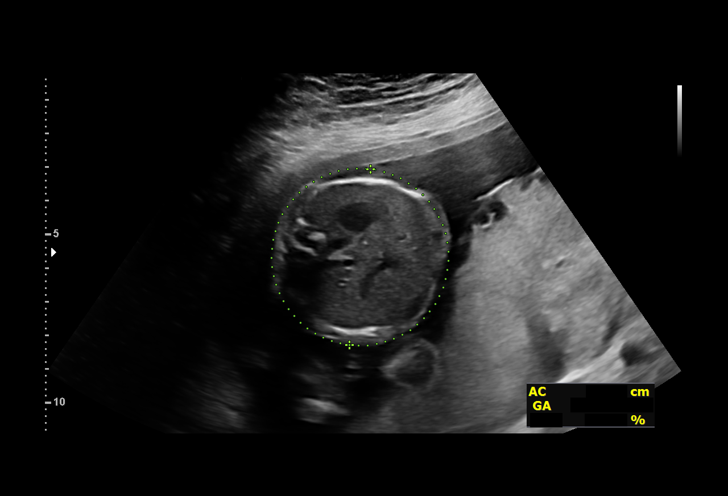
[im 29/53]
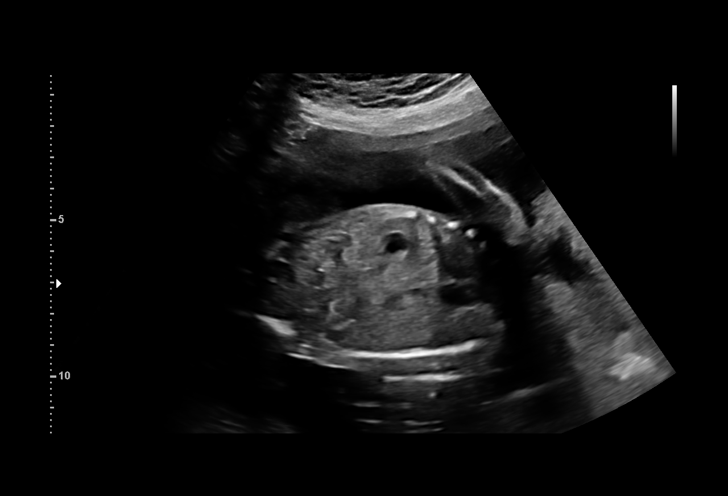
[im 33/53]
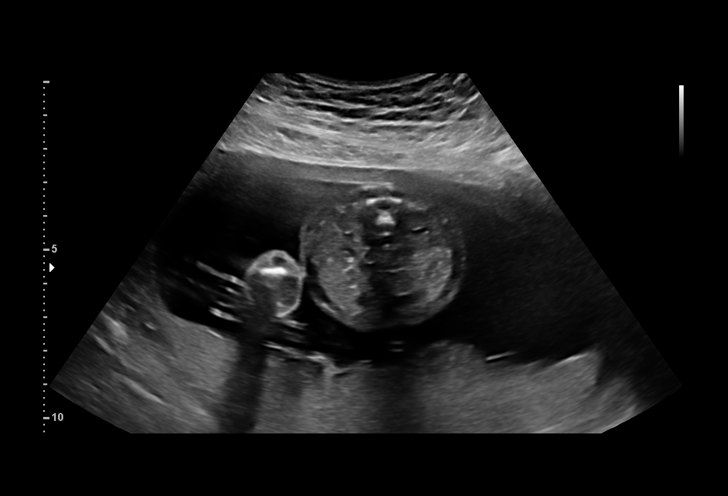
[im 37/53]
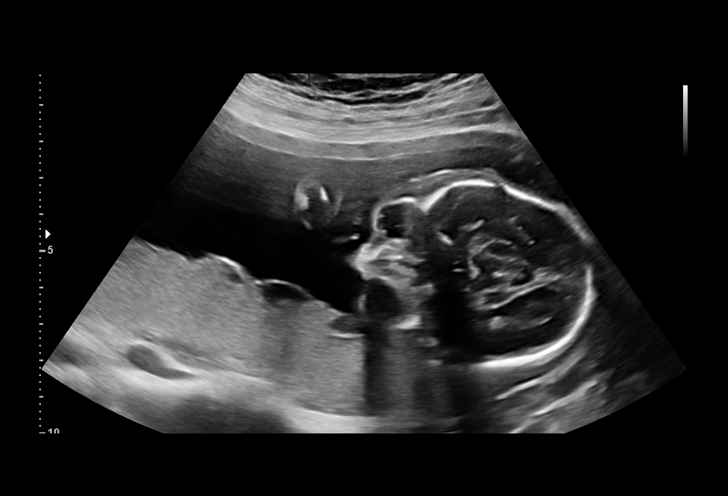
[im 41/53]
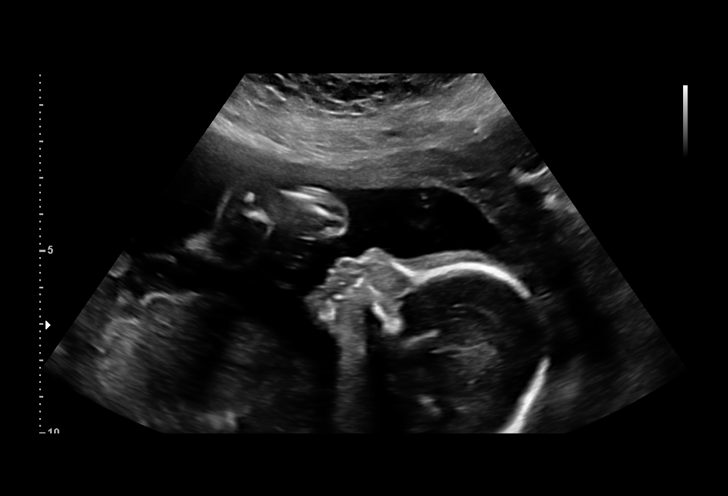
[im 45/53]
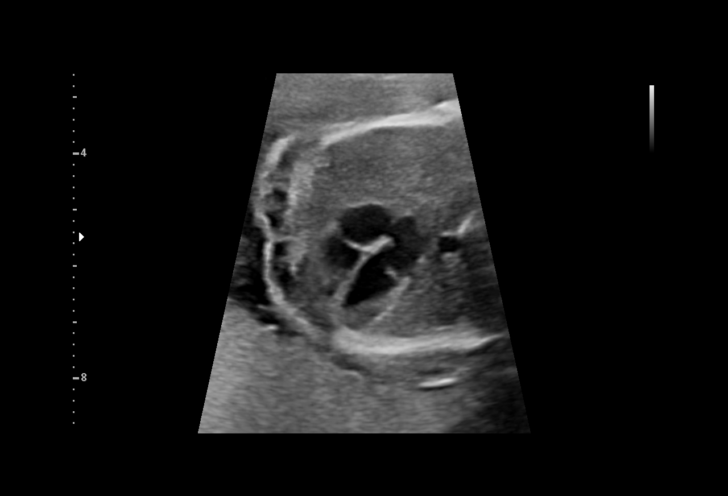
[im 49/53]
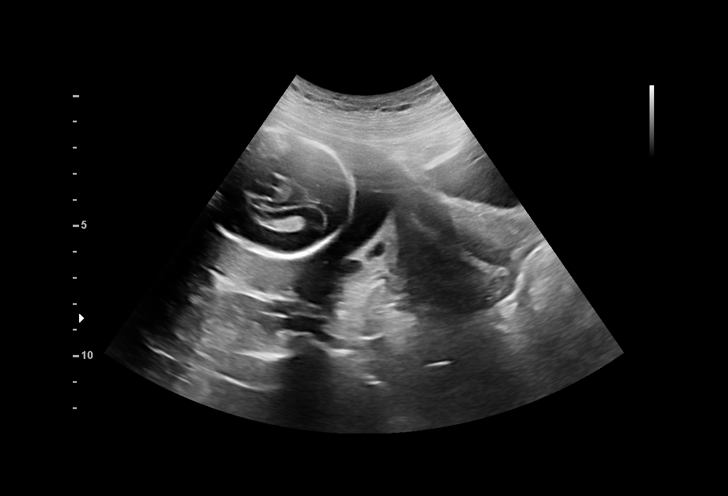
[im 53/53]
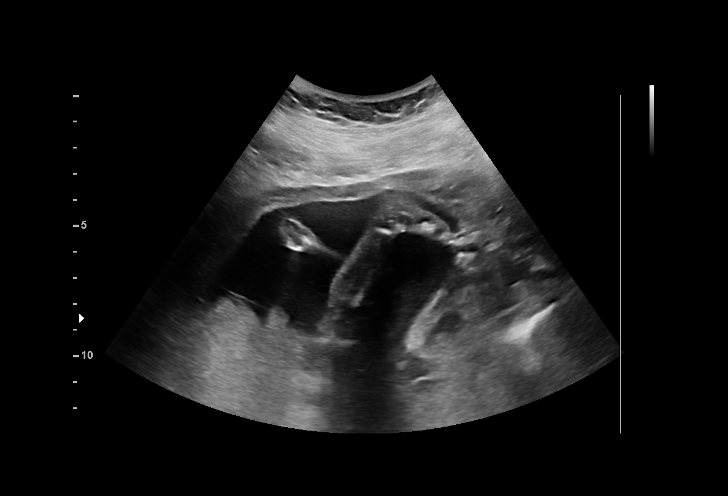

[14 of 28 positions shown; findings below may reference images not displayed]

[facility]

                                                            [address]

 ----------------------------------------------------------------------

 ----------------------------------------------------------------------
Indications

  Encounter for antenatal screening for
  malformations
  21 weeks gestation of pregnancy
  Fetal abnormality - other known or
  suspected (CPC)
 ----------------------------------------------------------------------
Vital Signs

                                                Height:        5'1"
Fetal Evaluation

 Num Of Fetuses:         1
 Fetal Heart Rate(bpm):  162
 Cardiac Activity:       Observed
 Presentation:           Cephalic
 Placenta:               Posterior
 P. Cord Insertion:      Previously seen as normal

 Amniotic Fluid
 AFI FV:      Within normal limits

                             Largest Pocket(cm)

Biometry

 BPD:      49.8  mm     G. Age:  21w 1d         34  %    CI:         75.6   %    70 - 86
                                                         FL/HC:      20.0   %    15.9 -
 HC:      181.6  mm     G. Age:  20w 4d         10  %    HC/AC:      1.10        1.06 -
 AC:      165.2  mm     G. Age:  21w 4d         47  %    FL/BPD:     72.9   %
 FL:       36.3  mm     G. Age:  21w 4d         44  %    FL/AC:      22.0   %    20 - 24
 HUM:      34.5  mm     G. Age:  21w 6d         57  %
 CER:      22.4  mm     G. Age:  21w 1d         42  %
 LV:        6.3  mm
 CM:        6.6  mm

 Est. FW:     423  gm    0 lb 15 oz      44  %
OB History

 Gravidity:    2         Term:   1
 Living:       1
Gestational Age

 U/S Today:     21w 2d                                        EDD:   02/19/20
 Best:          21w 3d     Det. By:  U/S  (09/12/19)          EDD:   02/18/20
Anatomy

 Cranium:               Previously seen        LVOT:                   Previously seen
 Cavum:                 Appears normal         Aortic Arch:            Previously seen
 Ventricles:            Appears normal         Ductal Arch:            Previously seen
 Choroid Plexus:        Resolved CPC           Diaphragm:              Appears normal
 Cerebellum:            Previously seen        Stomach:                Appears normal, left
                                                                       sided
 Posterior Fossa:       Appears normal         Abdomen:                Appears normal
 Nuchal Fold:           Not applicable (>20    Abdominal Wall:         Previously seen
                        wks GA)
 Face:                  Appears normal         Cord Vessels:           Previously seen
                        (orbits and profile)
 Lips:                  Appears normal         Kidneys:                Appear normal
 Palate:                Not well visualized    Bladder:                Appears normal
 Thoracic:              Appears normal         Spine:                  Previously seen
 Heart:                 Appears normal         Upper Extremities:      Previously seen
                        (4CH, axis, and
                        situs)
 RVOT:                  Previously seen        Lower Extremities:      Previously seen

 Other:  Male gender previously seen. Heels visualized.
Cervix Uterus Adnexa

 Cervix
 Normal appearance by transabdominal scan.
Impression

 Normal interval growth
 Anatomy complete
 Good fetal movement and amniotic fluid observed.

 Prior CP cyst seen, these were not observed during today's
 examination.
Recommendations

 Follow up growth as clinically indicated.

## 2021-10-03 ENCOUNTER — Telehealth: Payer: Self-pay | Admitting: Family Medicine

## 2021-10-03 NOTE — Telephone Encounter (Signed)
I return Pt call, LVM inform the Pt that she need to schedule an appt with they provider (if she need to see them) than call to schedule a financial appt

## 2021-10-03 NOTE — Telephone Encounter (Signed)
Copied from CRM [id number]. Topic: Appointment Scheduling - Scheduling Inquiry for Clinic >> Oct 01, 2021  1:26 PM Wyonia Hough E wrote: Reason for CRM: Pt called  to get an appt with Mikle Bosworth for the orange card / please advise

## 2024-07-21 ENCOUNTER — Ambulatory Visit: Payer: Self-pay | Admitting: Family Medicine

## 2024-08-09 ENCOUNTER — Encounter: Payer: Self-pay | Admitting: Family Medicine

## 2024-08-09 ENCOUNTER — Ambulatory Visit (INDEPENDENT_AMBULATORY_CARE_PROVIDER_SITE_OTHER): Payer: Self-pay | Admitting: Family Medicine

## 2024-08-09 VITALS — BP 102/65 | HR 55 | Ht 61.0 in | Wt 118.0 lb

## 2024-08-09 DIAGNOSIS — Z13228 Encounter for screening for other metabolic disorders: Secondary | ICD-10-CM

## 2024-08-09 DIAGNOSIS — Z0000 Encounter for general adult medical examination without abnormal findings: Secondary | ICD-10-CM

## 2024-08-09 DIAGNOSIS — Z130 Encounter for screening for diseases of the blood and blood-forming organs and certain disorders involving the immune mechanism: Secondary | ICD-10-CM

## 2024-08-09 DIAGNOSIS — Z13220 Encounter for screening for lipoid disorders: Secondary | ICD-10-CM

## 2024-08-09 DIAGNOSIS — O24420 Gestational diabetes mellitus in childbirth, diet controlled: Secondary | ICD-10-CM

## 2024-08-09 DIAGNOSIS — Z136 Encounter for screening for cardiovascular disorders: Secondary | ICD-10-CM

## 2024-08-09 DIAGNOSIS — L659 Nonscarring hair loss, unspecified: Secondary | ICD-10-CM | POA: Insufficient documentation

## 2024-08-09 DIAGNOSIS — Z7689 Persons encountering health services in other specified circumstances: Secondary | ICD-10-CM

## 2024-08-09 NOTE — Progress Notes (Signed)
 Complete physical exam  Patient: Nichole Hughes   DOB: 1985-12-01   38 y.o. Female  MRN: [id number]  Subjective:    Chief Complaint  Patient presents with   Establish Care    Hx Gestational diabetes 2021, high cholesterol no medication given bc breastfeeding Hair loss 1-2 years after giving birth Really just wants to have blood work done and a full physical    Annual Exam  Interpreter services in place during visit. Wishes to get physical today, will return for pap only visit. Will check thyroid function for hair loss. Lipid panel to assess cholesterol levels.   [name] is a 38 y.o. female who presents today for a complete physical exam. She reports consuming a general diet. Running and stretching.  She generally feels well. She reports sleeping fairly well. She does not have additional problems to discuss today.    Most recent fall risk assessment:    04/09/2021    8:50 AM  Fall Risk   Falls in the past year? 0  Number falls in past yr: 0  Injury with Fall? 0  Risk for fall due to : No Fall Risks     Most recent depression screenings:    04/09/2021    8:37 AM 08/29/2020    9:38 AM  PHQ 2/9 Scores  PHQ - 2 Score 0 0  PHQ- 9 Score 0  0      Data saved with a previous flowsheet row definition    Vision:Not within last year  and Dental: No current dental problems and No regular dental care     Patient Care Team: Delbert Clam, MD as PCP - General (Family Medicine)   Outpatient Medications Prior to Visit  Medication Sig   Cholecalciferol  (VITAMIN D3 PO) Take 1 capsule by mouth daily.   [DISCONTINUED] norethindrone  (MICRONOR ) 0.35 MG tablet TAKE 1 TABLET (0.35 MG TOTAL) BY MOUTH DAILY. (Patient not taking: Reported on 08/09/2024)   [DISCONTINUED] Prenatal Vit-Fe Fumarate-FA (PRENATAL VITAMIN PO) Take by mouth. (Patient not taking: Reported on 08/09/2024)   No facility-administered medications prior to visit.    ROS         Objective:     BP 102/65 (BP Location: Left Arm, Patient Position: Sitting, Cuff Size: Small)   Pulse (!) 55   Ht 5' 1 (1.549 m)   Wt 118 lb (53.5 kg)   SpO2 99%   BMI 22.30 kg/m    Physical Exam Vitals and nursing note reviewed.  Constitutional:      General: She is not in acute distress.    Appearance: Normal appearance.  HENT:     Right Ear: Tympanic membrane normal.     Left Ear: Tympanic membrane normal.     Nose: Nose normal.     Mouth/Throat:     Mouth: Mucous membranes are moist.     Pharynx: Oropharynx is clear.  Eyes:     Extraocular Movements: Extraocular movements intact.  Neck:     Thyroid: No thyroid tenderness.  Cardiovascular:     Rate and Rhythm: Normal rate and regular rhythm.     Pulses:          Radial pulses are 2+ on the right side and 2+ on the left side.     Heart sounds: Normal heart sounds, S1 normal and S2 normal.  Pulmonary:     Effort: Pulmonary effort is normal.     Breath sounds: Normal breath sounds.  Abdominal:  General: Bowel sounds are normal.     Palpations: Abdomen is soft.     Tenderness: There is no abdominal tenderness.  Musculoskeletal:        General: Normal range of motion.     Cervical back: Normal range of motion.     Right lower leg: No edema.     Left lower leg: No edema.  Lymphadenopathy:     Cervical:     Right cervical: No superficial cervical adenopathy.    Left cervical: No superficial cervical adenopathy.  Skin:    General: Skin is warm and dry.  Neurological:     General: No focal deficit present.     Mental Status: She is alert. Mental status is at baseline.  Psychiatric:        Mood and Affect: Mood normal.        Behavior: Behavior normal.        Thought Content: Thought content normal.        Judgment: Judgment normal.      No results found for any visits on 08/09/24.     Assessment & Plan:    Routine Health Maintenance and Physical Exam  Immunization History  Administered Date(s)  Administered   Influenza Split 06/26/2013   Influenza Whole 06/29/2019   Influenza,inj,Quad PF,6+ Mos 05/17/2014, 06/12/2015, 07/10/2016, 09/03/2017, 11/15/2018, 08/29/2020   Tdap 11/15/2018, 12/11/2019    Health Maintenance  Topic Date Due   Hepatitis B Vaccines 19-59 Average Risk (1 of 3 - 19+ 3-dose series) Never done   HPV VACCINES (1 - 3-dose SCDM series) Never done   Cervical Cancer Screening (HPV/Pap Cotest)  11/15/2023   COVID-19 Vaccine (1 - 2025-26 season) Never done   Influenza Vaccine  12/12/2024 (Originally 04/14/2024)   DTaP/Tdap/Td (3 - Td or Tdap) 12/10/2029   Hepatitis C Screening  Completed   HIV Screening  Completed   Pneumococcal Vaccine  Aged Out   Meningococcal B Vaccine  Aged Out    Discussed health benefits of physical activity, and encouraged her to engage in regular exercise appropriate for her age and condition.  Annual physical exam -     CBC -     Comprehensive metabolic panel with GFR -     Hgb A1c w/o eAG -     Lipid panel -     TSH + free T4  Establishing care with new doctor, encounter for  Encounter for screening for metabolic disorder -     Comprehensive metabolic panel with GFR -     Hgb A1c w/o eAG -     TSH + free T4  Encounter for lipid screening for cardiovascular disease -     Lipid panel  Gestational diabetes mellitus (GDM) in childbirth, diet controlled -     Hgb A1c w/o eAG  Hair loss -     TSH + free T4  Screening for deficiency anemia -     CBC      Routine labs ordered.  HCM reviewed/discussed. Anticipatory guidance regarding healthy weight, lifestyle and choices given. Recommend healthy diet.  Recommend approximately 150 minutes/week of moderate intensity exercise. Resistance training is good for building muscles and for bone health. Muscle mass helps to increase our metabolism and to burn more calories at rest.  Limit alcohol consumption: no more than one drink per day for women and 2 drinks per day for  me. Recommend regular dental and vision exams. Always use seatbelt/lap and shoulder restraints. Recommend using smoke alarms and checking  batteries at least twice a year. Recommend using sunscreen when outside. Agrees with plan of care discussed.  Questions answered.      Return in about 1 week (around 08/16/2024) for pap only visit (needs 40  minutes with interpreter) .     Darice JONELLE Brownie, FNP

## 2024-08-23 ENCOUNTER — Ambulatory Visit: Payer: Self-pay | Admitting: Family Medicine
